# Patient Record
Sex: Female | Born: 1970 | Hispanic: No | Marital: Married | Smoking: Never smoker
Health system: Southern US, Community
[De-identification: ages and names within clinical notes are randomized; demographics above are authoritative.]

## PROBLEM LIST (undated history)

## (undated) DIAGNOSIS — R011 Cardiac murmur, unspecified: Secondary | ICD-10-CM

## (undated) DIAGNOSIS — T7840XA Allergy, unspecified, initial encounter: Secondary | ICD-10-CM

## (undated) DIAGNOSIS — M810 Age-related osteoporosis without current pathological fracture: Secondary | ICD-10-CM

## (undated) HISTORY — PX: OTHER SURGICAL HISTORY: SHX169

## (undated) HISTORY — PX: TONSILLECTOMY: SUR1361

## (undated) HISTORY — PX: ABLATION: SHX5711

## (undated) HISTORY — DX: Cardiac murmur, unspecified: R01.1

## (undated) HISTORY — PX: COLONOSCOPY W/ POLYPECTOMY: SHX1380

## (undated) HISTORY — PX: APPENDECTOMY: SHX54

## (undated) HISTORY — DX: Allergy, unspecified, initial encounter: T78.40XA

## (undated) HISTORY — DX: Age-related osteoporosis without current pathological fracture: M81.0

---

## 1999-08-09 ENCOUNTER — Other Ambulatory Visit: Admission: RE | Admit: 1999-08-09 | Discharge: 1999-08-09 | Payer: Self-pay | Admitting: Orthopedic Surgery

## 2014-09-24 ENCOUNTER — Ambulatory Visit (INDEPENDENT_AMBULATORY_CARE_PROVIDER_SITE_OTHER): Payer: BLUE CROSS/BLUE SHIELD | Admitting: Neurology

## 2014-09-24 ENCOUNTER — Encounter: Payer: Self-pay | Admitting: Neurology

## 2014-09-24 DIAGNOSIS — M542 Cervicalgia: Secondary | ICD-10-CM

## 2014-09-24 DIAGNOSIS — M436 Torticollis: Secondary | ICD-10-CM

## 2014-09-24 DIAGNOSIS — Z0289 Encounter for other administrative examinations: Secondary | ICD-10-CM

## 2014-09-24 DIAGNOSIS — M62838 Other muscle spasm: Secondary | ICD-10-CM

## 2014-09-24 DIAGNOSIS — M6248 Contracture of muscle, other site: Secondary | ICD-10-CM

## 2014-09-24 NOTE — Progress Notes (Signed)
[facility]  PATIENT: Lindell SparMichelle Harvell DOB: 07/07/1971  REFERRING DOCTOR OR PCP:  N/A SOURCE: patient  _________________________________   HISTORICAL  CHIEF COMPLAINT:  Chief Complaint  Patient presents with  . Neck Pain    HISTORY OF PRESENT ILLNESS:  Fredda HammedMichele Voller is a 44 year old woman who had the sudden onset of neck pain several days ago. She notes that she turned her head as her husband walked into the room and had a sudden spasm of pain in the left upper neck. She reported that the pain stayed in the neck and did not radiate into the head or either arm or further down her spine. Pain is generally worse when she rotates her head to the left and is reduced when she is looking straight ahead. She denies any weakness or numbness in the arms or legs. She denies any bladder changes. She has been taking Motrin 600 or 800 mg 3 times a day without benefit. Her current pain is about the same as it was 2 days ago.  She has not had pain like this in the past. As otherwise healthy and is not on any current medications.  REVIEW OF SYSTEMS: Constitutional: No fevers, chills, sweats, or change in appetite Eyes: No visual changes, double vision, eye pain Ear, nose and throat: No hearing loss, ear pain, nasal congestion, sore throat Cardiovascular: No chest pain, palpitations Respiratory: No shortness of breath at rest or with exertion.   No wheezes GastrointestinaI: No nausea, vomiting, diarrhea, abdominal pain, fecal incontinence Genitourinary: No dysuria, urinary retention or frequency.  No nocturia. Musculoskeletal: see above Integumentary: No rash, pruritus, skin lesions Neurological: No numbness, weakness. No headache. Psychiatric: No depression at this time.  No anxiety Endocrine: No palpitations, diaphoresis, change in appetite, change in weigh or increased thirst Hematologic/Lymphatic: No anemia, purpura, petechiae. Allergic/Immunologic: No itchy/runny  eyes, nasal congestion, recent allergic reactions, rashes  ALLERGIES: Allergies  Allergen Reactions  . Sulfa Antibiotics Swelling  . Penicillins Rash    HOME MEDICATIONS: No current outpatient prescriptions on file.  PAST MEDICAL HISTORY: History reviewed. No pertinent past medical history.  PAST SURGICAL HISTORY: Past Surgical History  Procedure Laterality Date  . Tonsillectomy    . Appendectomy      FAMILY HISTORY: History reviewed. No pertinent family history.  SOCIAL HISTORY:  History   Social History  . Marital Status: Unknown    Spouse Name: N/A  . Number of Children: N/A  . Years of Education: N/A   Occupational History  . Not on file.   Social History Main Topics  . Smoking status: Never Smoker   . Smokeless tobacco: Not on file  . Alcohol Use: 0.0 oz/week    0 Standard drinks or equivalent per week     Comment: social  . Drug Use: No  . Sexual Activity: Not on file   Other Topics Concern  . Not on file   Social History Narrative  . No narrative on file     PHYSICAL EXAM  There were no vitals filed for this visit.  There is no height or weight on file to calculate BMI.   General: The patient is well-developed and well-nourished and in no acute distress  Neck: The neck is supple,  The neck is tender over left splenius capitis and upper carviacl paraspinal muscles but nontender in trapezius, rhomboids and shoulders.      Neurologic Exam  Mental status: The patient is alert and oriented x 3 at the time of the  examination.  Speech is normal.  Cranial nerves: Extraocular movements are full Facial symmetry is present. There is good facial sensation to soft touch bilaterally.Facial strength is normal.  Trapezius and sternocleidomastoid strength is normal. No dysarthria is noted.    Motor:  Muscle bulk is normal.   Tone is normal. Strength is  5 / 5 in arms   Sensory: Sensory testing is intact to touch in arms.  Coordination: Cerebellar  testing reveals good finger-nose-finger bilaterally.  Gait and station: Station is normal.   Gait is normal.    Reflexes: Deep tendon reflexes are symmetric and normal in arms.    DIAGNOSTIC DATA (LABS, IMAGING, TESTING) - I reviewed patient records, labs, notes, testing and imaging myself where available.   ASSESSMENT AND PLAN  Neck pain  Neck stiffness  Muscle spasms of neck   In summary, Monroe Toure is a 44 year old woman with recent sudden onset of neck pain who continues to have pain and stiffness in she appears to have some muscle spasms in the left upper neck on examination. Trigger point injection was performed of the left splenius capitis muscle and the C3/C4 paraspinal muscles. She tolerated the procedure well and there were no complications. She will report back tomorrow on her progress. If not better, I will call in a muscle relaxant. I would also consider x-ray at that time. She is advised to continue to be active. She will return to see me as needed.   Dally Oshel A. Epimenio Foot, MD, PhD 09/24/2014, 12:41 PM Certified in Neurology, Clinical Neurophysiology, Sleep Medicine, Pain Medicine and Neuroimaging  [facility] [address] [place], [place] [address] [phone]

## 2014-09-25 ENCOUNTER — Other Ambulatory Visit: Payer: Self-pay | Admitting: Neurology

## 2014-09-25 ENCOUNTER — Ambulatory Visit: Payer: Self-pay

## 2014-09-25 ENCOUNTER — Ambulatory Visit
Admission: RE | Admit: 2014-09-25 | Discharge: 2014-09-25 | Disposition: A | Discharge status: Home / Self Care | Payer: Self-pay | Source: Ambulatory Visit | Attending: Neurology | Admitting: Neurology

## 2014-09-25 DIAGNOSIS — M542 Cervicalgia: Secondary | ICD-10-CM

## 2016-05-19 DIAGNOSIS — M25522 Pain in left elbow: Secondary | ICD-10-CM

## 2016-05-19 DIAGNOSIS — M25521 Pain in right elbow: Secondary | ICD-10-CM | POA: Insufficient documentation

## 2016-08-07 ENCOUNTER — Other Ambulatory Visit: Payer: Self-pay | Admitting: *Deleted

## 2016-08-07 MED ORDER — OSELTAMIVIR PHOSPHATE 75 MG PO CAPS: 75.0000 mg | ORAL_CAPSULE | Freq: Every day | ORAL | 0 refills | Status: DC

## 2017-06-19 ENCOUNTER — Ambulatory Visit: Payer: BLUE CROSS/BLUE SHIELD | Attending: Orthopedic Surgery | Admitting: Occupational Therapy

## 2017-06-19 DIAGNOSIS — M6281 Muscle weakness (generalized): Secondary | ICD-10-CM | POA: Diagnosis present

## 2017-06-19 DIAGNOSIS — M25522 Pain in left elbow: Secondary | ICD-10-CM | POA: Diagnosis present

## 2017-06-19 NOTE — Therapy (Signed)
[facility] [address] [place], [place], [address] Phone: [phone]   Fax:  [phone]  Occupational Therapy Evaluation  Patient Details  Name: Susan SparMichelle Massey MRN: [id number] Date of Birth: 07/10/1971 Referring Provider: Karie ChimeraBrian Buchanan   Encounter Date: 06/19/2017  OT End of Session - 06/19/17 1300    Visit Number  1    Number of Visits  8    Date for OT Re-Evaluation  07/19/17    Authorization Type  BC/BS    OT Start Time  1145    OT Stop Time  1230    OT Time Calculation (min)  45 min    Activity Tolerance  Patient tolerated treatment well    Behavior During Therapy  [facility] for tasks assessed/performed       No past medical history on file.  Past Surgical History:  Procedure Laterality Date  . APPENDECTOMY    . TONSILLECTOMY      There were no vitals filed for this visit.  Subjective Assessment - 06/19/17 1155    Pertinent History  s/p percutaneous ECRB release Lt elbow 05/30/17, Rt lateral epicondylitis    Patient Stated Goals  To get better and hopefully painfree    Currently in Pain?  Yes    Pain Score  -- 2-8/10    Pain Location  Elbow    Pain Orientation  Left    Pain Descriptors / Indicators  Throbbing    Pain Type  Chronic pain    Aggravating Factors   in the am, stretching in flexion    Pain Relieving Factors  rest, heat        [facility] OT Assessment - 06/19/17 0001      Assessment   Diagnosis  s/p percutaneous ECRB Release    Referring Provider  Karie ChimeraBrian Buchanan    Onset Date  05/30/17    Assessment  Pt has full ROM, most limiting factor is pain    Prior Therapy  previous outpatient therapy      Precautions   Precaution Comments  Strengthening ok 3 weeks post-op      Balance Screen   Has the patient fallen in the past 6 months  No    Has the patient had a decrease in activity level because of a fear of falling?   No    Is the patient reluctant to leave their home because of a fear  of falling?   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      ADL   Eating/Feeding  Independent    Grooming  Independent    Upper Body Bathing  Independent    Lower Body Bathing  Independent    Upper Body Dressing  Increased time pain donning shirt    Lower Body Dressing  Independent    Toilet Transfer  Independent    Toileting -  Geneticist, molecularHygiene  Independent    Tub/Shower Transfer  Independent      IADL   Shopping  Takes care of all shopping needs independently    Light Housekeeping  Performs light daily tasks such as dishwashing, bed making    Meal Prep  Plans, prepares and serves adequate meals independently husband assist taking heavy items off stove    Medication Management  Is  responsible for taking medication in correct dosages at correct time    Financial Management  Manages financial matters independently (budgets, writes checks, pays rent, bills goes to bank), collects and keeps track of income      Mobility   Mobility Status  Independent      Written Expression   Dominant Hand  Right      Sensation   Light Touch  Appears Intact      Coordination   Gross Motor Movements are Fluid and Coordinated  Yes    Fine Motor Movements are Fluid and Coordinated  Yes    Coordination  intact      Edema   Edema  very mild at lateral epicondyle      ROM / Strength   AROM / PROM / Strength  AROM      AROM   Overall AROM Comments  BUE AROM WNL's, pain with active wrist ext when elbow in extended and extreme stretching in wrist flexion with full elbow ext and IR      Hand Function   Right Hand Grip (lbs)  55 lbs    Left Hand Grip (lbs)  49 lbs                      OT Education - 06/19/17 1301    Education provided  Yes    Education Details  use of heat/ice, how to perform proper massage, initial HEP (elbow flexed with wrist  flex/ext, elbow extended with wrist flex/ext)     Person(s) Educated  Patient    Methods  Explanation;Demonstration    Comprehension  Verbalized understanding          OT Long Term Goals - 06/19/17 1306      OT LONG TERM GOAL #1   Title  Independent with HEP     Time  4    Period  Weeks    Status  On-going    Target Date  07/19/17      OT LONG TERM GOAL #2   Title  Pt to report pain less than or equal to 5/10 consistently    Time  4    Period  Weeks    Status  New      OT LONG TERM GOAL #3   Title  Pt to return to using LUE as normal for bilateral tasks    Time  4    Period  Weeks    Status  New            Plan - 06/19/17 1302    Clinical Impression Statement  Pt is a 46 y.o. female who presents to outpatient rehab s/p percutaneous ECRB Release Lt elbow for pain management of lateral epicondylitis over several years. Pt also has lateral epicondylitis Rt elbow but pt reports fairly under control. Pt with pain with extreme stretching and certain tasks/movements at Lt elbow    Occupational Profile and client history currently impacting functional performance  Pt has had bilateral lateral epicondylitis for years limiting strength and ability to perform some IADLS and work related tasks.     Occupational performance deficits (Please refer to evaluation for details):  ADL's;IADL's;Work    Rehab Potential  Good    OT Frequency  2x / week    OT Duration  4 weeks    OT Treatment/Interventions  Moist Heat;Self-care/ADL training;Splinting;Patient/family education;Compression bandaging;Scar mobilization;Therapeutic activities;Ultrasound;DME and/or AE instruction;Fluidtherapy;Passive range of motion;Electrical Stimulation;Parrafin;Manual Therapy    Plan  ultrasound,  forearm gym, begin strengthening (per protocol)    Clinical Decision Making  Limited treatment options, no task modification necessary    Consulted and Agree with Plan of Care  Patient       Patient will benefit  from skilled therapeutic intervention in order to improve the following deficits and impairments:  Increased edema, Impaired UE functional use, Pain, Decreased strength  Visit Diagnosis: Muscle weakness (generalized) - Plan: Ot plan of care cert/re-cert  Pain in left elbow - Plan: Ot plan of care cert/re-cert    Problem List Patient Active Problem List   Diagnosis Date Noted  . Neck pain 09/24/2014  . Neck stiffness 09/24/2014  . Muscle spasms of neck 09/24/2014    Kelli Churn, OTR/L 06/19/2017, 2:23 PM  [facility] [address] [place], [place], [address] Phone: [phone]   Fax:  [phone]  Name: Susan Massey MRN: [id number] Date of Birth: 1971-07-19

## 2017-06-21 ENCOUNTER — Ambulatory Visit: Payer: BLUE CROSS/BLUE SHIELD | Admitting: Occupational Therapy

## 2017-06-21 DIAGNOSIS — M25522 Pain in left elbow: Secondary | ICD-10-CM

## 2017-06-21 DIAGNOSIS — M6281 Muscle weakness (generalized): Secondary | ICD-10-CM

## 2017-06-21 NOTE — Therapy (Signed)
[facility] [address] [place], [place], [address] Phone: [phone]   Fax:  [phone]  Occupational Therapy Treatment  Patient Details  Name: Susan Massey MRN: [id number] Date of Birth: 11/12/1970 Referring Provider: Karie ChimeraBrian Buchanan   Encounter Date: 06/21/2017  OT End of Session - 06/21/17 0934    Visit Number  2    Number of Visits  8    Date for OT Re-Evaluation  07/19/17    Authorization Type  BC/BS    OT Start Time  0845    OT Stop Time  0930    OT Time Calculation (min)  45 min    Activity Tolerance  Patient tolerated treatment well    Behavior During Therapy  [facility] for tasks assessed/performed       No past medical history on file.  Past Surgical History:  Procedure Laterality Date  . APPENDECTOMY    . TONSILLECTOMY      There were no vitals filed for this visit.  Subjective Assessment - 06/21/17 0909    Subjective   The pain is usually the worse after a full work day and in the am when I first wake up    Pertinent History  s/p percutaneous ECRB release Lt elbow 05/30/17, Rt lateral epicondylitis    Patient Stated Goals  To get better and hopefully painfree    Currently in Pain?  Yes    Pain Score  4     Pain Location  Elbow    Pain Orientation  Left    Pain Descriptors / Indicators  Sore;Throbbing    Pain Type  Chronic pain    Pain Onset  More than a month ago    Pain Frequency  Intermittent    Aggravating Factors   in the am, stretching in extreme flexion    Pain Relieving Factors  rest, heat                   OT Treatments/Exercises (OP) - 06/21/17 0001      ADLs   ADL Comments  Reviewed proper use of heat, ice, massage.       Exercises   Exercises  Elbow;Wrist      Elbow Exercises   Other elbow exercises  UBE x 5 min. Level 4 resistance    Other elbow exercises  Pt instructed to add elbow flexion and extension strengthening to HEP with 2 lb. weight (2-3x/day, 10  reps each). Pt agrees      Wrist Exercises   Other wrist exercises  Forearm gym x 4 full repetitions for combined wrist and forearm movement    Other wrist exercises  A/ROM, P/ROM and strengthening ex's in wrist flexion and extension - see pt instructions for details. Pt performed each x 10 reps      Modalities   Modalities  Ultrasound      Ultrasound   Ultrasound Location  lateral proximal elbow    Ultrasound Parameters  0.8 wts/cm2, 3 Mhz, Continuous x 8 min.     Ultrasound Goals  Pain             OT Education - 06/21/17 0926    Education provided  Yes    Education Details  A/ROM, P/ROM, and strengthening HEP, review of use of heat, ice, massage    Person(s) Educated  Patient    Methods  Explanation;Demonstration;Handout    Comprehension  Verbalized understanding;Returned demonstration          OT  Long Term Goals - 06/21/17 0934      OT LONG TERM GOAL #1   Title  Independent with HEP     Time  4    Period  Weeks    Status  Achieved      OT LONG TERM GOAL #2   Title  Pt to report pain less than or equal to 5/10 consistently    Time  4    Period  Weeks    Status  On-going      OT LONG TERM GOAL #3   Title  Pt to return to using LUE as normal for bilateral tasks    Time  4    Period  Weeks    Status  On-going            Plan - 06/21/17 0934    Clinical Impression Statement  Pt tolerating HEP well. Pt also responding well to ultrasound    Rehab Potential  Good    OT Frequency  2x / week    OT Duration  4 weeks    OT Treatment/Interventions  Moist Heat;Self-care/ADL training;Splinting;Patient/family education;Compression bandaging;Scar mobilization;Therapeutic activities;Ultrasound;DME and/or AE instruction;Fluidtherapy;Passive range of motion;Electrical Stimulation;Parrafin;Manual Therapy    Plan  continue ultrasound, stretches, and strengthening for Lt wrist/elbow    Consulted and Agree with Plan of Care  Patient       Patient will benefit from  skilled therapeutic intervention in order to improve the following deficits and impairments:  Increased edema, Impaired UE functional use, Pain, Decreased strength  Visit Diagnosis: Muscle weakness (generalized)  Pain in left elbow    Problem List Patient Active Problem List   Diagnosis Date Noted  . Neck pain 09/24/2014  . Neck stiffness 09/24/2014  . Muscle spasms of neck 09/24/2014    Kelli ChurnBallie, Filicia Scogin Johnson, OTR/L 06/21/2017, 9:36 AM  [facility] [address] [place], [place], [address] Phone: [phone]   Fax:  [phone]  Name: Susan Massey MRN: [id number] Date of Birth: 08/23/1970

## 2017-06-21 NOTE — Patient Instructions (Signed)
  Heat in the AM, followed by massage, and stretches in wrist flexion and extension with elbow straight (holding 20 sec. each way) x 3 to 5 reps  Active wrist flexion and extension with elbow bent, then with elbow straight 4-6x/day  Ice massage after work 5-7 minutes or ice pack for 15 minutes   STRENGTHENING: 2-3x/day  Elbow bent, palm down, wrist up and down x 10 reps with 2 lb. Weight. Repeat with palm up  Elbow straight, palm down, wrist up and down x 10 reps with 1 lb. Weight. Repeat with palm up

## 2017-06-22 ENCOUNTER — Encounter: Payer: Self-pay | Admitting: Occupational Therapy

## 2017-06-26 ENCOUNTER — Ambulatory Visit: Payer: BLUE CROSS/BLUE SHIELD | Attending: Orthopedic Surgery | Admitting: Occupational Therapy

## 2017-06-26 DIAGNOSIS — M25522 Pain in left elbow: Secondary | ICD-10-CM | POA: Diagnosis present

## 2017-06-26 DIAGNOSIS — M6281 Muscle weakness (generalized): Secondary | ICD-10-CM | POA: Insufficient documentation

## 2017-06-26 NOTE — Therapy (Signed)
[facility] [address] [place], [place], [address] Phone: [phone]   Fax:  [phone]  Occupational Therapy Treatment  Patient Details  Name: Susan SparMichelle Boise MRN: [id number] Date of Birth: 07/07/1971 Referring Provider: Karie ChimeraBrian Buchanan   Encounter Date: 06/26/2017  OT End of Session - 06/26/17 1305    Visit Number  3    Number of Visits  8    Date for OT Re-Evaluation  07/19/17    Authorization Type  BC/BS    OT Start Time  1145    OT Stop Time  1230    OT Time Calculation (min)  45 min    Activity Tolerance  Patient tolerated treatment well    Behavior During Therapy  [facility] for tasks assessed/performed       No past medical history on file.  Past Surgical History:  Procedure Laterality Date  . APPENDECTOMY    . TONSILLECTOMY      There were no vitals filed for this visit.  Subjective Assessment - 06/26/17 1300    Subjective   It's about the same    Pertinent History  s/p percutaneous ECRB release Lt elbow 05/30/17, Rt lateral epicondylitis    Patient Stated Goals  To get better and hopefully painfree    Currently in Pain?  Yes    Pain Score  4     Pain Location  Elbow    Pain Orientation  Left    Pain Descriptors / Indicators  Sore;Tender    Pain Type  Chronic pain    Pain Onset  More than a month ago    Pain Frequency  Intermittent    Aggravating Factors   in the am, stretching in extreme wrist flexion    Pain Relieving Factors  rest, heat                   OT Treatments/Exercises (OP) - 06/26/17 0001      Elbow Exercises   Other elbow exercises  Elbow flexion and extension each x 10 reps against gravity with 2 lb. weight      Wrist Exercises   Other wrist exercises  wrist flexion/extension each x 10 reps with 2 lb. weight elbow bent, followed by wrist flex/ext each x 10 reps with 1 lb. weight with elbow straight      Ultrasound   Ultrasound Location  lateral proximal elbow    Ultrasound Parameters  0.8 wts/cm2, 3 Mhz, continuous x 8 min.    Ultrasound Goals  Pain      Manual Therapy   Manual Therapy  Soft tissue mobilization    Soft tissue mobilization  along wrist extensor musculature                  OT Long Term Goals - 06/21/17 0934      OT LONG TERM GOAL #1   Title  Independent with HEP     Time  4    Period  Weeks    Status  Achieved      OT LONG TERM GOAL #2   Title  Pt to report pain less than or equal to 5/10 consistently    Time  4    Period  Weeks    Status  On-going      OT LONG TERM GOAL #3   Title  Pt to return to using LUE as normal for bilateral tasks    Time  4    Period  Weeks  Status  On-going            Plan - 06/26/17 1305    Clinical Impression Statement  Pt continues to make steady progress towards goals and tolerating strengthening well.     Rehab Potential  Good    OT Frequency  2x / week    OT Duration  4 weeks    OT Treatment/Interventions  Self-care/ADL training;Moist Heat;Fluidtherapy;DME and/or AE instruction;Splinting;Therapeutic activities;Ultrasound;Therapeutic exercise;Scar mobilization;Cryotherapy;Iontophoresis;Passive range of motion;Electrical Stimulation;Paraffin;Manual Therapy;Patient/family education    Plan  continue US, UBE, stretches and strengthening Lt wrist and elbow, add forearm supination and pronation w/ hammer, wrist winder    Consulted and Agree with Plan of Care  Patient       Patient will benefit from skilled therapeutic intervention in order to improve the following deficits and impairments:  Increased edema, Impaired UE functional use, Pain, Decreased strength  Visit Diagnosis: Muscle weakness (generalized)  Pain in left elbow    Problem List Patient Active Problem List   Diagnosis Date Noted  . Neck pain 09/24/2014  . Neck stiffness 09/24/2014  . Muscle spasms of neck 09/24/2014    Kelli ChurnBallie, Ronold Hardgrove Johnson, OTR/L 06/26/2017, 1:09 PM  [facility] [address] [place], [place], [address] Phone: [phone]   Fax:  [phone]  Name: Susan Massey MRN: [id number] Date of Birth: 07/14/1971

## 2017-06-28 ENCOUNTER — Ambulatory Visit: Payer: BLUE CROSS/BLUE SHIELD | Admitting: Occupational Therapy

## 2017-06-28 DIAGNOSIS — M6281 Muscle weakness (generalized): Secondary | ICD-10-CM

## 2017-06-28 DIAGNOSIS — M25522 Pain in left elbow: Secondary | ICD-10-CM

## 2017-06-28 NOTE — Therapy (Signed)
[facility] [address] [place], [place], [address] Phone: [phone]   Fax:  [phone]  Occupational Therapy Treatment  Patient Details  Name: Susan Massey MRN: [id number] Date of Birth: 09/11/1970 Referring Provider: Karie ChimeraBrian Buchanan   Encounter Date: 06/28/2017  OT End of Session - 06/28/17 1517    Visit Number  4    Number of Visits  8    Date for OT Re-Evaluation  07/19/17    Authorization Type  BC/BS    OT Start Time  1450    OT Stop Time  1530    OT Time Calculation (min)  40 min    Activity Tolerance  Patient tolerated treatment well    Behavior During Therapy  [facility] for tasks assessed/performed       No past medical history on file.  Past Surgical History:  Procedure Laterality Date  . APPENDECTOMY    . TONSILLECTOMY      There were no vitals filed for this visit.  Subjective Assessment - 06/28/17 1528    Pertinent History  s/p percutaneous ECRB release Lt elbow 05/30/17, Rt lateral epicondylitis    Patient Stated Goals  To get better and hopefully painfree    Currently in Pain?  Yes    Pain Score  4     Pain Location  Elbow    Pain Orientation  Left    Pain Descriptors / Indicators  Tender;Sore    Pain Type  Chronic pain    Pain Onset  More than a month ago    Pain Frequency  Intermittent    Aggravating Factors   elbow extension, overuse    Pain Relieving Factors  rest, heat             Treatment:  Elbow Exercises   Other elbow exercises  Elbow flexion and extension each x 10 reps against gravity with 2 lb. weight      Wrist Exercises   Other wrist exercises  wrist flexion/extension each x 10 reps with 2 lb. weight elbow bent, followed by wrist flex/ext each x 10 reps with 1 lb. weight with elbow straight  Wrist winder 3 reps each up/ down with 2 lbs weight, hammer for weighted supination/ pronation, arm bike x 6 mins level 3 for conditioning     Ultrasound   Ultrasound Location   lateral proximal elbow    Ultrasound Parameters  0.8 wts/cm2, 3 Mhz, 20% x 8 min.    Ultrasound Goals  Pain      Manual Therapy   Manual Therapy  Soft tissue mobilization    Soft tissue mobilization  along wrist extensor musculature                         OT Long Term Goals - 06/21/17 0934      OT LONG TERM GOAL #1   Title  Independent with HEP     Time  4    Period  Weeks    Status  Achieved      OT LONG TERM GOAL #2   Title  Pt to report pain less than or equal to 5/10 consistently    Time  4    Period  Weeks    Status  On-going      OT LONG TERM GOAL #3   Title  Pt to return to using LUE as normal for bilateral tasks    Time  4  Period  Weeks    Status  On-going            Plan - 06/28/17 1511    Clinical Impression Statement  Pt continues to make steady progress towards goals.    Occupational Profile and client history currently impacting functional performance  Pt has had bilateral lateral epicondylitis for years limiting strength and ability to perform some IADLS and work related tasks.     Rehab Potential  Good    OT Frequency  2x / week    OT Duration  4 weeks    OT Treatment/Interventions  Self-care/ADL training;Moist Heat;Fluidtherapy;DME and/or AE instruction;Splinting;Therapeutic activities;Ultrasound;Therapeutic exercise;Scar mobilization;Cryotherapy;Iontophoresis;Passive range of motion;Electrical Stimulation;Paraffin;Manual Therapy;Patient/family education    Plan  continue US, UBE, stretches and strengthening Lt wrist and elbow,     Clinical Decision Making  Limited treatment options, no task modification necessary    Consulted and Agree with Plan of Care  Patient       Patient will benefit from skilled therapeutic intervention in order to improve the following deficits and impairments:  Increased edema, Impaired UE functional use, Pain, Decreased strength  Visit Diagnosis: Muscle weakness (generalized)  Pain in left  elbow    Problem List Patient Active Problem List   Diagnosis Date Noted  . Neck pain 09/24/2014  . Neck stiffness 09/24/2014  . Muscle spasms of neck 09/24/2014    Toyia Jelinek 06/28/2017, 3:29 PM Keene BreathKathryn Rayvion Stumph, OTR/L [phone] Phone: [phone] 3:29 PM 06/28/17 [facility] [address] [place], [place], [address] Phone: [phone]   Fax:  [phone]  Name: Susan Massey MRN: [id number] Date of Birth: 04/01/1971

## 2017-06-29 ENCOUNTER — Encounter: Payer: BLUE CROSS/BLUE SHIELD | Admitting: Occupational Therapy

## 2017-07-03 ENCOUNTER — Ambulatory Visit: Payer: BLUE CROSS/BLUE SHIELD | Admitting: Occupational Therapy

## 2017-07-03 DIAGNOSIS — M6281 Muscle weakness (generalized): Secondary | ICD-10-CM

## 2017-07-03 DIAGNOSIS — M25522 Pain in left elbow: Secondary | ICD-10-CM

## 2017-07-03 NOTE — Therapy (Signed)
[facility] [address] [place], [place], [address] Phone: [phone]   Fax:  [phone]  Occupational Therapy Treatment  Patient Details  Name: Susan Massey MRN: [id number] Date of Birth: 07/22/1971 Referring Provider (Historical): Karie ChimeraBrian Buchanan   Encounter Date: 07/03/2017  OT End of Session - 07/03/17 1127    Visit Number  5    Number of Visits  8    Date for OT Re-Evaluation  07/19/17    Authorization Type  BC/BS    OT Start Time  1103    OT Stop Time  1145    OT Time Calculation (min)  42 min    Activity Tolerance  Patient tolerated treatment well    Behavior During Therapy  [facility] for tasks assessed/performed       No past medical history on file.  Past Surgical History:  Procedure Laterality Date  . APPENDECTOMY    . TONSILLECTOMY      There were no vitals filed for this visit.  Subjective Assessment - 07/03/17 1120    Subjective   Pt reports only pain with hiolding a weight    Pertinent History  s/p percutaneous ECRB release Lt elbow 05/30/17, Rt lateral epicondylitis    Patient Stated Goals  To get better and hopefully painfree    Currently in Pain?  Yes    Pain Score  3     Pain Location  Elbow    Pain Orientation  Left    Pain Descriptors / Indicators  Aching    Pain Type  Chronic pain    Pain Onset  More than a month ago    Pain Frequency  Intermittent    Aggravating Factors   elbow extension    Pain Relieving Factors  rest, heat                   Treatment:  Elbow Exercises         Wrist Exercises   Other wrist exercises  wrist flexion/extension each x 10 reps with 2 lb. weight elbow bent, followed by wrist flex/ext each x 10 reps with 2 lb. weight with elbow straight  Wrist winder 3 reps each up/ down with 2 lbs weight, hammer for weighted supination/ pronation, arm bike x 8 mins level 3 for conditioning     Ultrasound   Ultrasound Location  lateral proximal elbow     Ultrasound Parameters  0.8 wts/cm2, 3 Mhz, continuous x 8 min.    Ultrasound Goals  Pain      Manual Therapy   Manual Therapy  Soft tissue mobilization    Soft tissue mobilization  along wrist extensor musculature                   OT Long Term Goals - 06/21/17 0934      OT LONG TERM GOAL #1   Title  Independent with HEP     Time  4    Period  Weeks    Status  Achieved      OT LONG TERM GOAL #2   Title  Pt to report pain less than or equal to 5/10 consistently    Time  4    Period  Weeks    Status  On-going      OT LONG TERM GOAL #3   Title  Pt to return to using LUE as normal for bilateral tasks    Time  4    Period  Weeks  Status  On-going            Plan - 07/03/17 1151    Clinical Impression Statement  Pt continues to make steady progress towards goals. Pt verbalizes understanding of weighted supination/pronation.    Rehab Potential  Good    OT Frequency  2x / week    OT Duration  4 weeks    OT Treatment/Interventions  Self-care/ADL training;Moist Heat;Fluidtherapy;DME and/or AE instruction;Splinting;Therapeutic activities;Ultrasound;Therapeutic exercise;Scar mobilization;Cryotherapy;Iontophoresis;Passive range of motion;Electrical Stimulation;Paraffin;Manual Therapy;Patient/family education    Plan  continue US, UBE, stretches and strengthening Lt wrist and elbow,        Patient will benefit from skilled therapeutic intervention in order to improve the following deficits and impairments:  Increased edema, Impaired UE functional use, Pain, Decreased strength  Visit Diagnosis: Muscle weakness (generalized)  Pain in left elbow    Problem List Patient Active Problem List   Diagnosis Date Noted  . Neck pain 09/24/2014  . Neck stiffness 09/24/2014  . Muscle spasms of neck 09/24/2014   Keene BreathKathryn Azjah Pardo, OTR/L [phone] Phone: [phone] 11:58 AM 07/03/17 Christohper Dube 07/03/2017, 11:55 AM  [facility] [address] [place], [place], [address] Phone: [phone]   Fax:  [phone]  Name: Susan Massey MRN: [id number] Date of Birth: 03/01/1971

## 2017-07-03 NOTE — Patient Instructions (Signed)
 .        AROM: Wrist Flexion    AROM: Forearm Pronation / Supination   With _left_ arm in handshake position, slowly rotate palm down until stretch is felt. Relax. Then rotate palm up until stretch is felt. Repeat _15___ times per set. Do 1-_2___ sessions per day. Hold a Paramediclightweight hammer  Copyright  V[employer]. All rights reserved.

## 2017-07-06 ENCOUNTER — Ambulatory Visit: Payer: BLUE CROSS/BLUE SHIELD | Admitting: Occupational Therapy

## 2017-07-06 DIAGNOSIS — M25522 Pain in left elbow: Secondary | ICD-10-CM

## 2017-07-06 DIAGNOSIS — M6281 Muscle weakness (generalized): Secondary | ICD-10-CM

## 2017-07-06 NOTE — Patient Instructions (Addendum)
Dr. Amanda PeaGramig,  Susan Massey has been receiving occupational therapy for her LUE  ECRB release. We have been using US, massage, stretching and performing strengthening. She reports her pain is consistently 3/10 or less and mainly only present during lifting activities. She is only scheduled for 1 more appointment and is ready to transition to exercising at home. Thanks for this referral.  Sincerely,  Susan Massey, OTR/L

## 2017-07-06 NOTE — Therapy (Signed)
[facility] [address] [place], [place], [address] Phone: [phone]   Fax:  [phone]  Occupational Therapy Treatment  Patient Details  Name: Susan Massey MRN: [id number] Date of Birth: 09/02/1970 Referring Provider (Historical): Karie ChimeraBrian Buchanan   Encounter Date: 07/06/2017  OT End of Session - 07/06/17 0856    Visit Number  6    Number of Visits  8    Date for OT Re-Evaluation  07/19/17    Authorization Type  BC/BS    OT Start Time  0806    OT Stop Time  0844    OT Time Calculation (min)  38 min    Activity Tolerance  Patient tolerated treatment well    Behavior During Therapy  [facility] for tasks assessed/performed       No past medical history on file.  Past Surgical History:  Procedure Laterality Date  . APPENDECTOMY    . TONSILLECTOMY      There were no vitals filed for this visit.  Subjective Assessment - 07/06/17 0827    Pertinent History  s/p percutaneous ECRB release Lt elbow 05/30/17, Rt lateral epicondylitis    Patient Stated Goals  To get better and hopefully painfree    Currently in Pain?  Yes    Pain Score  2     Pain Location  Elbow    Pain Orientation  Left    Pain Descriptors / Indicators  Aching    Pain Type  Chronic pain    Pain Onset  More than a month ago    Pain Frequency  Intermittent    Aggravating Factors   lifting    Pain Relieving Factors  rest, heat                Treatment: Elbow Exercises   Other elbow exercises  Elbow flexion and extension each x 10 reps against gravity with 3 lb. weight      Wrist Exercises   Other wrist exercises  wrist flexion/extension each x 10 reps with 2 lb. weight elbow bent, followed by wrist flex/ext each x 10 reps with 2 lb. weight with elbow straight  Wrist winder 3 reps each up/ down with 2 lbs weight, hammer for weighted supination/ pronation, arm bike x 8 mins level 3 for conditioning     Ultrasound   Ultrasound Location   lateral proximal elbow    Ultrasound Parameters  0.8 wts/cm2, 3 Mhz, continuous x 8 min.    Ultrasound Goals  Pain      Manual Therapy   Manual Therapy  Soft tissue mobilization    Soft tissue mobilization  along wrist extensor musculature             Note sent with pt to MD regarding progress.         OT Long Term Goals - 07/06/17 [id number]      OT LONG TERM GOAL #1   Title  Independent with HEP     Time  4    Period  Weeks    Status  Achieved      OT LONG TERM GOAL #2   Title  Pt to report pain less than or equal to 5/10 consistently    Time  4    Period  Weeks    Status  Achieved      OT LONG TERM GOAL #3   Title  Pt to return to using LUE as normal for bilateral tasks    Time  4    Period  Weeks    Status  On-going 90% of the time            Plan - 07/06/17 0857    Clinical Impression Statement  Pt demonstrates excellent overall progress. She sees MD today and pt may d/c from OT after her MD visit if MD is in agreement.    Rehab Potential  Good    OT Frequency  2x / week    OT Duration  4 weeks    OT Treatment/Interventions  Self-care/ADL training;Moist Heat;Fluidtherapy;DME and/or AE instruction;Splinting;Therapeutic activities;Ultrasound;Therapeutic exercise;Scar mobilization;Cryotherapy;Iontophoresis;Passive range of motion;Electrical Stimulation;Paraffin;Manual Therapy;Patient/family education    Plan  discharge in the next visit or 2.    Consulted and Agree with Plan of Care  Patient       Patient will benefit from skilled therapeutic intervention in order to improve the following deficits and impairments:  Increased edema, Impaired UE functional use, Pain, Decreased strength  Visit Diagnosis: Muscle weakness (generalized)  Pain in left elbow    Problem List Patient Active Problem List   Diagnosis Date Noted  . Neck pain 09/24/2014  . Neck stiffness 09/24/2014  . Muscle spasms of neck 09/24/2014    RINE,KATHRYN 07/06/2017, 8:59  AM Keene BreathKathryn Rine, OTR/L [phone] Phone: [phone] 9:01 AM 07/06/17 [facility] [address] [place], [place], [address] Phone: [phone]   Fax:  [phone]  Name: Susan Massey MRN: [id number] Date of Birth: 01/13/1971

## 2017-07-10 ENCOUNTER — Encounter: Payer: BLUE CROSS/BLUE SHIELD | Admitting: Occupational Therapy

## 2017-07-18 ENCOUNTER — Ambulatory Visit: Payer: Self-pay | Admitting: Family Medicine

## 2017-07-19 ENCOUNTER — Ambulatory Visit: Payer: BLUE CROSS/BLUE SHIELD | Admitting: Family Medicine

## 2017-07-27 ENCOUNTER — Encounter: Payer: Self-pay | Admitting: Family Medicine

## 2017-07-27 ENCOUNTER — Other Ambulatory Visit: Payer: Self-pay

## 2017-07-27 ENCOUNTER — Ambulatory Visit: Payer: BLUE CROSS/BLUE SHIELD | Admitting: Family Medicine

## 2017-07-27 VITALS — BP 96/67 | HR 101 | Temp 99.1°F | Resp 16 | Ht 65.75 in | Wt 132.6 lb

## 2017-07-27 DIAGNOSIS — Z7689 Persons encountering health services in other specified circumstances: Secondary | ICD-10-CM | POA: Diagnosis not present

## 2017-07-27 DIAGNOSIS — R011 Cardiac murmur, unspecified: Secondary | ICD-10-CM | POA: Diagnosis not present

## 2017-07-27 MED ORDER — MOMETASONE FUROATE 50 MCG/ACT NA SUSP: NASAL | 3 refills | Status: DC

## 2017-07-27 MED ORDER — LEVOCETIRIZINE DIHYDROCHLORIDE 5 MG PO TABS: ORAL_TABLET | ORAL | 3 refills | Status: DC

## 2017-07-27 NOTE — Progress Notes (Signed)
Chief Complaint  Patient presents with  . New Patient (Initial Visit)    establish care, not issue or concerns    HPI  Pt is here to establish care She is pretty healthy but her PCP left practice She reports that she [facility] and was told she had a murmur and should see a PCP She has no history of murmurs She denies any fatigue or other issues   Past Medical History:  Diagnosis Date  . Allergy   . Heart murmur     Current Outpatient Medications  Medication Sig Dispense Refill  . levocetirizine (XYZAL) 5 MG tablet TAKE 1 TABLET (5 MG TOTAL) BY MOUTH EVERY EVENING. 90 tablet 3  . meloxicam (MOBIC) 15 MG tablet Take 15 mg by mouth.    . mometasone (NASONEX) 50 MCG/ACT nasal spray 2 sprays by Each Nare route daily. 51 g 3   No current facility-administered medications for this visit.     Allergies:  Allergies  Allergen Reactions  . Sulfa Antibiotics Swelling  . Penicillins Rash    Past Surgical History:  Procedure Laterality Date  . ABLATION    . APPENDECTOMY    . ganglion cyst removal    . left percutaneous release    . meniscus tear    . TONSILLECTOMY      Social History   Socioeconomic History  . Marital status: Married    Spouse name: None  . Number of children: None  . Years of education: None  . Highest education level: None  Social Needs  . Financial resource strain: Not hard at all  . Food insecurity - worry: Never true  . Food insecurity - inability: Never true  . Transportation needs - medical: No  . Transportation needs - non-medical: No  Occupational History  . None  Tobacco Use  . Smoking status: Never Smoker  . Smokeless tobacco: Never Used  Substance and Sexual Activity  . Alcohol use: Yes    Alcohol/week: 0.0 oz    Comment: social  . Drug use: No  . Sexual activity: None  Other Topics Concern  . None  Social History Narrative  . None    Family History  Problem Relation Age of Onset  . Hyperlipidemia Father    . Hypertension Father   . Diabetes Father   . Cancer Maternal Grandmother   . COPD Maternal Grandfather   . Diabetes Maternal Grandfather   . Stroke Paternal Grandmother   . Alzheimer's disease Paternal Grandmother   . Diabetes Paternal Grandfather      Review of Systems  Constitutional: Negative for chills, fever and weight loss.  Respiratory: Negative for cough, hemoptysis, sputum production, shortness of breath and wheezing.   Cardiovascular: Negative for chest pain, palpitations, orthopnea, claudication, leg swelling and PND.  Skin: Negative for itching and rash.  Neurological: Negative for dizziness, tingling, tremors, sensory change and headaches.    Objective: Vitals:   07/27/17 0932  BP: 96/67  Pulse: (!) 101  Resp: 16  Temp: 99.1 F (37.3 C)  TempSrc: Oral  SpO2: 98%  Weight: 132 lb 9.6 oz (60.1 kg)  Height: 5' 5.75" (1.67 m)    Physical Exam  Constitutional: She is oriented to person, place, and time. She appears well-developed and well-nourished.  HENT:  Head: Normocephalic and atraumatic.  Eyes: Conjunctivae and EOM are normal.  Cardiovascular: Normal rate and regular rhythm.  Murmur heard.  Systolic murmur is present with a grade of 2/6. Pulmonary/Chest: Effort  normal and breath sounds normal. No stridor. No respiratory distress. She has no wheezes.  Neurological: She is alert and oriented to person, place, and time.  Skin: Skin is warm. Capillary refill takes less than 2 seconds.  Psychiatric: She has a normal mood and affect. Her behavior is normal. Judgment and thought content normal.    Assessment and Plan Marcelino DusterMichelle was seen today for new patient (initial visit).  Diagnoses and all orders for this visit:  Establishing care with new doctor, encounter for  Newly recognized heart murmur-  Discussed that she should get an echo since this is a new murmur She is asymptomatic so will get as routine outpatient mgmt -     ECHOCARDIOGRAM COMPLETE;  Future  Other orders -     Cancel: Tdap vaccine greater than or equal to 7yo IM -     Cancel: Flu Vaccine QUAD 36+ mos IM -     mometasone (NASONEX) 50 MCG/ACT nasal spray; 2 sprays by Each Nare route daily. -     levocetirizine (XYZAL) 5 MG tablet; TAKE 1 TABLET (5 MG TOTAL) BY MOUTH EVERY EVENING.     Dalinda Heidt A Barclay Lennox

## 2017-07-27 NOTE — Patient Instructions (Addendum)
IF you received an x-ray today, you will receive an invoice from [facility]. Please contact [facility] at [phone] with questions or concerns regarding your invoice.   IF you received labwork today, you will receive an invoice from [facility]. Please contact [facility] at [phone] with questions or concerns regarding your invoice.   Our billing staff will not be able to assist you with questions regarding bills from these companies.  You will be contacted with the lab results as soon as they are available. The fastest way to get your results is to activate your My Chart account. Instructions are located on the last page of this paperwork. If you have not heard from [place] regarding the results in 2 weeks, please contact this office.     Heart Murmur A heart murmur is an extra sound that is caused by chaotic blood flow. The murmur can be heard as a "hum" or "whoosh" sound when blood flows through the heart. The heart has four areas called chambers. Valves separate the upper and lower chambers from each other (tricuspid valve and mitral valve) and separate the lower chambers of the heart from pathways that lead away from the heart (aortic valve and pulmonary valve). Normally, the valves open to let blood flow through or out of your heart, and then they shut to keep the blood from flowing backward. There are two types of heart murmurs:  Innocent murmurs. Most people with this type of heart murmur do not have a heart problem. Many children have innocent heart murmurs. Your health care provider may suggest some basic testing to find out whether your murmur is an innocent murmur. If an innocent heart murmur is found, there is no need for further tests or treatment and no need to restrict activities or stop playing sports.  Abnormal murmurs. These types of murmurs can occur in children and adults. Abnormal murmurs may be a sign of a more serious heart condition, such as a heart  defect present at birth (congenital defect) or heart valve disease.  What are the causes? This condition is caused by heart valves that are not working properly. In children, abnormal heart murmurs are typically caused by congenital defects. In adults, abnormal murmurs are usually from heart valve problems caused by disease, infection, or aging. Three types of heart valve defects can cause a murmur:  Regurgitation. This is when blood leaks back through the valve in the wrong direction.  Mitral valve prolapse. This is when the mitral valve of the heart has a loose flap and does not close tightly.  Stenosis. This is when a valve does not open enough and blocks blood flow.  This condition may also be caused by:  Pregnancy.  Fever.  Overactive thyroid gland.  Anemia.  Exercise.  Rapid growth spurts (in children).  What are the signs or symptoms? Innocent murmurs do not cause symptoms, and many people with abnormal murmurs may or may not have symptoms. If symptoms do develop, they may include:  Shortness of breath.  Blue coloring of the skin, especially on the fingertips.  Chest pain.  Palpitations, or feeling a fluttering or skipped heartbeat.  Fainting.  Persistent cough.  Getting tired much faster than expected.  Swelling in the abdomen, feet, or ankles.  How is this diagnosed? This condition may be diagnosed during a routine physical or other exam. If your health care provider hears a murmur with a stethoscope, he or she will listen for:  Where the murmur is  located in your heart.  How long the murmur lasts (duration).  When the murmur is heard during the heartbeat.  How loud the murmur is. This may help the health care provider figure out what is causing the murmur.  You may be referred to a heart specialist (cardiologist). You may also have other tests, including:  Electrocardiogram (ECG or EKG). This test measures the electrical activity of your  heart.  Echocardiogram. This test uses high frequency sound waves to make pictures of your heart.  MRI or chest X-ray.  Cardiac catheterization. This test looks at blood flow through the heart.  For children and adults who have an abnormal heart murmur and want to stay active, it is important to complete testing, review test results, and receive recommendations from your health care provider. If heart disease is present, it may not be safe to play or be active. How is this treated? Heart murmurs themselves do not need treatment. In some cases, a heart murmur may go away on its own. If an underlying problem or disease is causing the murmur, you may need treatment. If treatment is needed, it will depend on the type and severity of the disease or heart problem causing the murmur. Treatment may include:  Medicine.  Surgery.  Dietary and lifestyle changes.  Follow these instructions at home:  Talk with your health care provider before participating in sports or other activities that require a lot of effort and energy (are strenuous).  Learn as much as possible about your condition and any related diseases. Ask your health care provider if you may at risk for any medical emergencies.  Talk with your health care provider about what symptoms you should look out for.  It is up to you to get your test results. Ask your health care provider, or the department that is doing the test, when your results will be ready.  Keep all follow-up visits as told by your health care provider. This is important. Contact a health care provider if:  You feel light-headed.  You are frequently short of breath.  You feel more tired than usual.  You are having a hard time keeping up with normal activities or fitness routines.  You have swelling in your ankles or feet.  You have chest pain.  You notice that your heart often beats irregularly.  You develop any new symptoms. Get help right away if:  You  develop severe chest pain.  You are having trouble breathing.  You have fainting spells.  Your symptoms suddenly get worse. These symptoms may represent a serious problem that is an emergency. Do not wait to see if the symptoms will go away. Get medical help right away. Call your local emergency services (911 in the [place] Do not drive yourself to the hospital. Summary  Normally, the heart valves open to let blood flow through or out of your heart, and then they shut to keep the blood from flowing backward.  Heart murmur is caused by heart valves that are not working properly.  You may need treatment if an underlying problem or disease is causing the heart murmur. Treatment may include medicine, surgery, or dietary and lifestyle changes.  Talk with your health care provider before participating in sports or other activities that require a lot of effort and energy (are strenuous).  Talk with your health care provider about what symptoms you should watch out for. This information is not intended to replace advice given to you by your health care   provider. Make sure you discuss any questions you have with your health care provider. Document Released: 08/17/2004 Document Revised: 06/28/2016 Document Reviewed: 06/28/2016 Elsevier Interactive Patient Education  [employer]2018 [employer]

## 2017-08-10 ENCOUNTER — Other Ambulatory Visit: Payer: Self-pay

## 2017-08-10 ENCOUNTER — Ambulatory Visit (HOSPITAL_COMMUNITY): Payer: BLUE CROSS/BLUE SHIELD | Attending: Cardiology

## 2017-08-10 DIAGNOSIS — R011 Cardiac murmur, unspecified: Secondary | ICD-10-CM | POA: Diagnosis present

## 2018-06-19 ENCOUNTER — Telehealth: Payer: Self-pay | Admitting: Family Medicine

## 2018-06-19 NOTE — Telephone Encounter (Signed)
MyChart message sent to pt about rescheduling their appt on 08/02/18

## 2018-08-02 ENCOUNTER — Encounter: Payer: BLUE CROSS/BLUE SHIELD | Admitting: Family Medicine

## 2018-08-05 ENCOUNTER — Other Ambulatory Visit: Payer: Self-pay | Admitting: Family Medicine

## 2018-08-05 NOTE — Telephone Encounter (Signed)
Requested medication (s) are due for refill today: Yes  Requested medication (s) are on the active medication list: Yes  Last refill:  07/27/17  Future visit scheduled: Yes  Notes to clinic:  Unable to refill, expired Rx     Requested Prescriptions  Pending Prescriptions Disp Refills   levocetirizine (XYZAL) 5 MG tablet [Pharmacy Med Name: LEVOCETIRIZINE 5 MG TABLET] 90 tablet 3    Sig: TAKE 1 TABLET BY MOUTH EVERY DAY IN THE EVENING     Ear, Nose, and Throat:  Antihistamines Failed - 08/05/2018  1:52 AM      Failed - Valid encounter within last 12 months    Recent Outpatient Visits          1 year ago Newly recognized heart murmur   Primary Care at [facility], Manus Rudd, MD      Future Appointments            In 2 weeks Doristine Bosworth, MD Primary Care at [place], [facility]

## 2018-08-23 ENCOUNTER — Encounter: Payer: Self-pay | Admitting: Family Medicine

## 2018-08-23 ENCOUNTER — Encounter: Payer: BLUE CROSS/BLUE SHIELD | Admitting: Family Medicine

## 2018-08-23 ENCOUNTER — Other Ambulatory Visit: Payer: Self-pay

## 2018-08-23 ENCOUNTER — Ambulatory Visit (INDEPENDENT_AMBULATORY_CARE_PROVIDER_SITE_OTHER): Payer: BLUE CROSS/BLUE SHIELD | Admitting: Family Medicine

## 2018-08-23 ENCOUNTER — Other Ambulatory Visit: Payer: Self-pay | Admitting: Family Medicine

## 2018-08-23 VITALS — BP 116/80 | HR 75 | Temp 97.5°F | Resp 17 | Ht 65.75 in | Wt 136.6 lb

## 2018-08-23 DIAGNOSIS — Z136 Encounter for screening for cardiovascular disorders: Secondary | ICD-10-CM

## 2018-08-23 DIAGNOSIS — Z0000 Encounter for general adult medical examination without abnormal findings: Secondary | ICD-10-CM

## 2018-08-23 DIAGNOSIS — Z124 Encounter for screening for malignant neoplasm of cervix: Secondary | ICD-10-CM

## 2018-08-23 DIAGNOSIS — Z1329 Encounter for screening for other suspected endocrine disorder: Secondary | ICD-10-CM

## 2018-08-23 DIAGNOSIS — Z13220 Encounter for screening for lipoid disorders: Secondary | ICD-10-CM

## 2018-08-23 DIAGNOSIS — Z0001 Encounter for general adult medical examination with abnormal findings: Secondary | ICD-10-CM

## 2018-08-23 DIAGNOSIS — E559 Vitamin D deficiency, unspecified: Secondary | ICD-10-CM

## 2018-08-23 DIAGNOSIS — N926 Irregular menstruation, unspecified: Secondary | ICD-10-CM

## 2018-08-23 NOTE — Patient Instructions (Signed)
° ° ° °  If you have lab work done today you will be contacted with your lab results within the next 2 weeks.  If you have not heard from us then please contact us. The fastest way to get your results is to register for My Chart. ° ° °IF you received an x-ray today, you will receive an invoice from [facility]. Please contact [facility] at [phone] with questions or concerns regarding your invoice.  ° °IF you received labwork today, you will receive an invoice from [facility]. Please contact [facility] at [phone] with questions or concerns regarding your invoice.  ° °Our billing staff will not be able to assist you with questions regarding bills from these companies. ° °You will be contacted with the lab results as soon as they are available. The fastest way to get your results is to activate your My Chart account. Instructions are located on the last page of this paperwork. If you have not heard from us regarding the results in 2 weeks, please contact this office. °  ° ° ° °

## 2018-08-24 LAB — COMPLETE METABOLIC PANEL WITH GFR
AG Ratio: 2.2 (calc) (ref 1.0–2.5)
ALT: 12 U/L (ref 6–29)
AST: 18 U/L (ref 10–35)
Albumin: 4.4 g/dL (ref 3.6–5.1)
Alkaline phosphatase (APISO): 43 U/L (ref 33–115)
BILIRUBIN TOTAL: 0.6 mg/dL (ref 0.2–1.2)
BUN: 19 mg/dL (ref 7–25)
CO2: 28 mmol/L (ref 20–32)
Calcium: 9.5 mg/dL (ref 8.6–10.2)
Chloride: 105 mmol/L (ref 98–110)
Creat: 0.67 mg/dL (ref 0.50–1.10)
GFR, EST NON AFRICAN AMERICAN: 104 mL/min/{1.73_m2} (ref >=60)
GFR, Est African American: 120 mL/min/{1.73_m2} (ref >=60)
Globulin: 2 g/dL (calc) (ref 1.9–3.7)
Glucose, Bld: 86 mg/dL (ref 65–99)
Potassium: 4.4 mmol/L (ref 3.5–5.3)
Sodium: 140 mmol/L (ref 135–146)
Total Protein: 6.4 g/dL (ref 6.1–8.1)

## 2018-08-24 LAB — TSH: TSH: 0.73 mIU/L

## 2018-08-24 LAB — LIPID PANEL
Cholesterol: 157 mg/dL (ref <200)
HDL: 58 mg/dL (ref >50)
LDL Cholesterol (Calc): 85 mg/dL (calc)
Non-HDL Cholesterol (Calc): 99 mg/dL (calc) (ref <130)
Total CHOL/HDL Ratio: 2.7 (calc) (ref <5.0)
Triglycerides: 48 mg/dL (ref <150)

## 2018-08-24 LAB — VITAMIN D 25 HYDROXY (VIT D DEFICIENCY, FRACTURES): Vit D, 25-Hydroxy: 25 ng/mL — ABNORMAL LOW (ref 30–100)

## 2018-08-26 NOTE — Progress Notes (Signed)
Chief Complaint  Patient presents with  . Annual Exam    cpe with pap    Subjective:  Susan Massey is a 48 y.o. female here for a health maintenance visit.  Patient is established pt  Patient Active Problem List   Diagnosis Date Noted  . Arthralgia of both elbows 05/19/2016  . Neck pain 09/24/2014  . Neck stiffness 09/24/2014  . Muscle spasms of neck 09/24/2014    Past Medical History:  Diagnosis Date  . Allergy   . Heart murmur     Past Surgical History:  Procedure Laterality Date  . ABLATION    . APPENDECTOMY    . ganglion cyst removal    . left percutaneous release    . meniscus tear    . TONSILLECTOMY       Outpatient Medications Prior to Visit  Medication Sig Dispense Refill  . levocetirizine (XYZAL) 5 MG tablet TAKE 1 TABLET (5 MG TOTAL) BY MOUTH EVERY EVENING. 90 tablet 3  . mometasone (NASONEX) 50 MCG/ACT nasal spray 2 sprays by Each Nare route daily. 51 g 3  . meloxicam (MOBIC) 15 MG tablet Take 15 mg by mouth.     No facility-administered medications prior to visit.     Allergies  Allergen Reactions  . Sulfa Antibiotics Swelling  . Penicillins Rash     Family History  Problem Relation Age of Onset  . Hyperlipidemia Father   . Hypertension Father   . Diabetes Father   . Cancer Maternal Grandmother   . COPD Maternal Grandfather   . Diabetes Maternal Grandfather   . Stroke Paternal Grandmother   . Alzheimer's disease Paternal Grandmother   . Diabetes Paternal Grandfather      Health Habits: Dental Exam: up to date Eye Exam: up to date Exercise: 5 times/week on average Current exercise activities: walking/running Diet: balanced  Social History   Socioeconomic History  . Marital status: Married    Spouse name: Not on file  . Number of children: Not on file  . Years of education: Not on file  . Highest education level: Not on file  Occupational History  . Not on file  Social Needs  . Financial resource strain: Not hard at all   . Food insecurity:    Worry: Never true    Inability: Never true  . Transportation needs:    Medical: No    Non-medical: No  Tobacco Use  . Smoking status: Never Smoker  . Smokeless tobacco: Never Used  Substance and Sexual Activity  . Alcohol use: Yes    Alcohol/week: 0.0 standard drinks    Comment: social  . Drug use: No  . Sexual activity: Not on file  Lifestyle  . Physical activity:    Days per week: 2 days    Minutes per session: 30 min  . Stress: Not at all  Relationships  . Social connections:    Talks on phone: More than three times a week    Gets together: More than three times a week    Attends religious service: More than 4 times per year    Active member of club or organization: Yes    Attends meetings of clubs or organizations: More than 4 times per year    Relationship status: Married  . Intimate partner violence:    Fear of current or ex partner: No    Emotionally abused: No    Physically abused: No    Forced sexual activity: No  Other Topics Concern  .  Not on file  Social History Narrative  . Not on file   Social History   Substance and Sexual Activity  Alcohol Use Yes  . Alcohol/week: 0.0 standard drinks   Comment: social   Social History   Tobacco Use  Smoking Status Never Smoker  Smokeless Tobacco Never Used   Social History   Substance and Sexual Activity  Drug Use No    GYN: Sexual Health Menstrual status: regular menses LMP: Patient's last menstrual period was 08/18/2018. Last pap smear: see HM section History of abnormal pap smears:  Sexually active:  with female partner Current contraception:   Health Maintenance: See under health Maintenance activity for review of completion dates as well. Immunization History  Administered Date(s) Administered  . Influenza-Unspecified 04/18/2016  . Tdap 05/19/2016     Depression Screen-PHQ2/9 Depression screen PHQ 2/9 07/27/2017  Decreased Interest 0  Down, Depressed, Hopeless 0   PHQ - 2 Score 0    Depression Severity and Treatment Recommendations:  0-4= None  5-9= Mild / Treatment: Support, educate to call if worse; return in one month  10-14= Moderate / Treatment: Support, watchful waiting; Antidepressant or Psycotherapy  15-19= Moderately severe / Treatment: Antidepressant OR Psychotherapy  >= 20 = Major depression, severe / Antidepressant AND Psychotherapy    Review of Systems   ROS  See HPI for ROS as well.  Review of Systems  Constitutional: Negative for activity change, appetite change, chills and fever.  HENT: Negative for congestion, nosebleeds, trouble swallowing and voice change.   Respiratory: Negative for cough, shortness of breath and wheezing.   Gastrointestinal: Negative for diarrhea, nausea and vomiting.  Genitourinary: Negative for difficulty urinating, dysuria, flank pain and hematuria.  Musculoskeletal: Negative for back pain, joint swelling and neck pain.  Neurological: Negative for dizziness, speech difficulty, light-headedness and numbness.  See HPI. All other review of systems negative.    Objective:   Vitals:   08/23/18 0856  BP: 116/80  Pulse: 75  Resp: 17  Temp: (!) 97.5 F (36.4 C)  TempSrc: Oral  SpO2: 98%  Weight: 136 lb 9.6 oz (62 kg)  Height: 5' 5.75" (1.67 m)    Body mass index is 22.22 kg/m.  Physical Exam  BP 116/80 (BP Location: Left Arm, Patient Position: Sitting, Cuff Size: Normal)   Pulse 75   Temp (!) 97.5 F (36.4 C) (Oral)   Resp 17   Ht 5' 5.75" (1.67 m)   Wt 136 lb 9.6 oz (62 kg)   LMP 08/18/2018   SpO2 98%   BMI 22.22 kg/m   General Appearance:    Alert, cooperative, no distress, appears stated age  Head:    Normocephalic, without obvious abnormality, atraumatic  Eyes:    PERRL, conjunctiva/corneas clear, EOM's intact  Ears:    Normal TM's and external ear canals, both ears  Nose:   Nares normal, septum midline, mucosa normal, no drainage    or sinus tenderness  Throat:   Lips,  mucosa, and tongue normal; teeth and gums normal  Neck:   Supple, symmetrical, trachea midline, no adenopathy;    thyroid:  no enlargement/tenderness/nodules; no carotid   bruit or JVD  Back:     Symmetric, no curvature, ROM normal, no CVA tenderness  Lungs:     Clear to auscultation bilaterally, respirations unlabored  Chest Wall:    No tenderness or deformity   Heart:    Regular rate and rhythm, S1 and S2 normal, no murmur, rub   or gallop  Breast Exam:    No tenderness, masses, or nipple abnormality  Abdomen:     Soft, non-tender, bowel sounds active all four quadrants,    no masses, no organomegaly  Genitalia:    Normal female without lesion, discharge or tenderness, menstrual blood in the vaginal vault Pap smear performed No pelvic masses or CMT  Extremities:   Extremities normal, atraumatic, no cyanosis or edema  Pulses:   2+ and symmetric all extremities  Skin:   Skin color, texture, turgor normal, no rashes or lesions  Lymph nodes:   Cervical, supraclavicular, and axillary nodes normal  Neurologic:   CNII-XII intact, normal strength, sensation and reflexes    throughout      Assessment/Plan:   Patient was seen for a health maintenance exam.  Counseled the patient on health maintenance issues. Reviewed her health mainteance schedule and ordered appropriate tests (see orders.) Counseled on regular exercise and weight management. Recommend regular eye exams and dental cleaning.   The following issues were addressed today for health maintenance:   Tikvah was seen today for annual exam.  Diagnoses and all orders for this visit:  Encounter for health maintenance examination  Screening for malignant neoplasm of cervix - Discussed cervical cancer screening  As well as screening for HPV Discussed risk factors for cervical cancer  And hpv vaccination records reviewed  -     Pap IG, CT/NG NAA, and HPV (high risk) [facility]  Vitamin D deficiency -     VITAMIN D 25  Hydroxy (Vit-D Deficiency, Fractures); Future -     Comprehensive metabolic panel; Future  Encounter for lipid screening for cardiovascular disease -     Lipid panel; Future -     TSH; Future -     Comprehensive metabolic panel; Future  Screening for thyroid disorder -     TSH; Future  Irregular menses -     TSH; Future -     Comprehensive metabolic panel; Future    No follow-ups on file.    Body mass index is 22.22 kg/m.:  Discussed the patient's BMI with patient. The BMI body mass index is 22.22 kg/m.     No future appointments.  Patient Instructions       If you have lab work done today you will be contacted with your lab results within the next 2 weeks.  If you have not heard from [place] then please contact us. The fastest way to get your results is to register for My Chart.   IF you received an x-ray today, you will receive an invoice from [facility]. Please contact [facility] at [phone] with questions or concerns regarding your invoice.   IF you received labwork today, you will receive an invoice from [facility]. Please contact [facility] at [phone] with questions or concerns regarding your invoice.   Our billing staff will not be able to assist you with questions regarding bills from these companies.  You will be contacted with the lab results as soon as they are available. The fastest way to get your results is to activate your My Chart account. Instructions are located on the last page of this paperwork. If you have not heard from [place] regarding the results in 2 weeks, please contact this office.

## 2018-08-28 ENCOUNTER — Telehealth: Payer: Self-pay | Admitting: Family Medicine

## 2018-08-28 NOTE — Telephone Encounter (Signed)
Please let the patient know that her labs from [facility] shows that her vitamin D is slightly low at 25 so she should take a multivitamin with D  The cholesterol panel is normal and her liver and kidney is normal.

## 2018-08-28 NOTE — Telephone Encounter (Signed)
Spoke with pt advised her labs showed her vitamin D was low at 25 and she should take multivitamin with D.  Advised cholesterol, liver and kidney panels were normal.  Pt verbalizes understanding but advises she does 2,000 units of vitamin D now and should she add more or what to do?  I advised would contact dr Creta Levin with her question.  Per stallings pt is to add multivitamin along with the 2000 units of vitamin d she is currently taking.  Called pt back no answer but left message on voicemail per ROI to add multivitamin to the vitamin d she is currently taking as it will give her an additional 400-600 units of vitamin.  Advised to call back with any questions or concerns.

## 2018-08-29 LAB — PAP IG, CT-NG NAA, HPV HIGH-RISK
Chlamydia, Nuc. Acid Amp: NEGATIVE
Gonococcus by Nucleic Acid Amp: NEGATIVE
HPV, high-risk: NEGATIVE

## 2018-08-30 LAB — HM MAMMOGRAPHY

## 2019-08-08 ENCOUNTER — Other Ambulatory Visit (HOSPITAL_COMMUNITY): Payer: Self-pay | Admitting: Orthopedic Surgery

## 2019-08-08 ENCOUNTER — Other Ambulatory Visit: Payer: Self-pay | Admitting: Orthopedic Surgery

## 2019-08-08 DIAGNOSIS — M25511 Pain in right shoulder: Secondary | ICD-10-CM

## 2019-08-29 ENCOUNTER — Ambulatory Visit: Payer: Self-pay | Admitting: Family Medicine

## 2019-09-05 ENCOUNTER — Ambulatory Visit: Payer: Self-pay | Admitting: Family Medicine

## 2019-09-16 ENCOUNTER — Telehealth: Payer: Self-pay | Admitting: Neurology

## 2019-09-16 MED ORDER — GABAPENTIN 100 MG PO CAPS: 100.0000 mg | ORAL_CAPSULE | Freq: Every day | ORAL | 4 refills | Status: DC

## 2019-09-16 NOTE — Telephone Encounter (Signed)
Chronic, uncontrollable itching of upper back,   Will try gabapentin 100mg  qhs

## 2019-09-19 ENCOUNTER — Other Ambulatory Visit: Payer: Self-pay

## 2019-09-19 ENCOUNTER — Ambulatory Visit (INDEPENDENT_AMBULATORY_CARE_PROVIDER_SITE_OTHER): Payer: BC Managed Care – PPO | Admitting: Family Medicine

## 2019-09-19 ENCOUNTER — Encounter: Payer: Self-pay | Admitting: Family Medicine

## 2019-09-19 VITALS — BP 107/74 | HR 85 | Temp 97.8°F | Ht 65.0 in | Wt 130.8 lb

## 2019-09-19 DIAGNOSIS — Z13220 Encounter for screening for lipoid disorders: Secondary | ICD-10-CM

## 2019-09-19 DIAGNOSIS — Z136 Encounter for screening for cardiovascular disorders: Secondary | ICD-10-CM

## 2019-09-19 DIAGNOSIS — Z0000 Encounter for general adult medical examination without abnormal findings: Secondary | ICD-10-CM

## 2019-09-19 DIAGNOSIS — Z23 Encounter for immunization: Secondary | ICD-10-CM

## 2019-09-19 DIAGNOSIS — Z114 Encounter for screening for human immunodeficiency virus [HIV]: Secondary | ICD-10-CM

## 2019-09-19 DIAGNOSIS — E559 Vitamin D deficiency, unspecified: Secondary | ICD-10-CM | POA: Diagnosis not present

## 2019-09-19 DIAGNOSIS — Z0001 Encounter for general adult medical examination with abnormal findings: Secondary | ICD-10-CM | POA: Diagnosis not present

## 2019-09-19 DIAGNOSIS — Z131 Encounter for screening for diabetes mellitus: Secondary | ICD-10-CM

## 2019-09-19 NOTE — Patient Instructions (Addendum)
If you have lab work done today you will be contacted with your lab results within the next 2 weeks.  If you have not heard from [place] then please contact us. The fastest way to get your results is to register for My Chart.   IF you received an x-ray today, you will receive an invoice from [facility]. Please contact [facility] at [phone] with questions or concerns regarding your invoice.   IF you received labwork today, you will receive an invoice from [facility]. Please contact [facility] at [phone] with questions or concerns regarding your invoice.   Our billing staff will not be able to assist you with questions regarding bills from these companies.  You will be contacted with the lab results as soon as they are available. The fastest way to get your results is to activate your My Chart account. Instructions are located on the last page of this paperwork. If you have not heard from [place] regarding the results in 2 weeks, please contact this office.      Perimenopause  Perimenopause is the normal time of life before and after menstrual periods stop completely (menopause). Perimenopause can begin 2-8 years before menopause, and it usually lasts for 1 year after menopause. During perimenopause, the ovaries may or may not produce an egg. What are the causes? This condition is caused by a natural change in hormone levels that happens as you get older. What increases the risk? This condition is more likely to start at an earlier age if you have certain medical conditions or treatments, including:  A tumor of the pituitary gland in the brain.  A disease that affects the ovaries and hormone production.  Radiation treatment for cancer.  Certain cancer treatments, such as chemotherapy or hormone (anti-estrogen) therapy.  Heavy smoking and excessive alcohol use.  Family history of early menopause. What are the signs or symptoms? Perimenopausal changes affect each  woman differently. Symptoms of this condition may include:  Hot flashes.  Night sweats.  Irregular menstrual periods.  Decreased sex drive.  Vaginal dryness.  Headaches.  Mood swings.  Depression.  Memory problems or trouble concentrating.  Irritability.  Tiredness.  Weight gain.  Anxiety.  Trouble getting pregnant. How is this diagnosed? This condition is diagnosed based on your medical history, a physical exam, your age, your menstrual history, and your symptoms. Hormone tests may also be done. How is this treated? In some cases, no treatment is needed. You and your health care provider should make a decision together about whether treatment is necessary. Treatment will be based on your individual condition and preferences. Various treatments are available, such as:  Menopausal hormone therapy (MHT).  Medicines to treat specific symptoms.  Acupuncture.  Vitamin or herbal supplements. Before starting treatment, make sure to let your health care provider know if you have a personal or family history of:  Heart disease.  Breast cancer.  Blood clots.  Diabetes.  Osteoporosis. Follow these instructions at home: Lifestyle  Do not use any products that contain nicotine or tobacco, such as cigarettes and e-cigarettes. If you need help quitting, ask your health care provider.  Eat a balanced diet that includes fresh fruits and vegetables, whole grains, soybeans, eggs, lean meat, and low-fat dairy.  Get at least 30 minutes of physical activity on 5 or more days each week.  Avoid alcoholic and caffeinated beverages, as well as spicy foods. This may help prevent hot flashes.  Get 7-8 hours of sleep each  night.  Dress in layers that can be removed to help you manage hot flashes.  Find ways to manage stress, such as deep breathing, meditation, or journaling. General instructions  Keep track of your menstrual periods, including: ? When they occur. ? How  heavy they are and how long they last. ? How much time passes between periods.  Keep track of your symptoms, noting when they start, how often you have them, and how long they last.  Take over-the-counter and prescription medicines only as told by your health care provider.  Take vitamin supplements only as told by your health care provider. These may include calcium, vitamin E, and vitamin D.  Use vaginal lubricants or moisturizers to help with vaginal dryness and improve comfort during sex.  Talk with your health care provider before starting any herbal supplements.  Keep all follow-up visits as told by your health care provider. This is important. This includes any group therapy or counseling. Contact a health care provider if:  You have heavy vaginal bleeding or pass blood clots.  Your period lasts more than 2 days longer than normal.  Your periods are recurring sooner than 21 days.  You bleed after having sex. Get help right away if:  You have chest pain, trouble breathing, or trouble talking.  You have severe depression.  You have pain when you urinate.  You have severe headaches.  You have vision problems. Summary  Perimenopause is the time when a woman's body begins to move into menopause. This may happen naturally or as a result of other health problems or medical treatments.  Perimenopause can begin 2-8 years before menopause, and it usually lasts for 1 year after menopause.  Perimenopausal symptoms can be managed through medicines, lifestyle changes, and complementary therapies such as acupuncture. This information is not intended to replace advice given to you by your health care provider. Make sure you discuss any questions you have with your health care provider. Document Revised: 06/22/2017 Document Reviewed: 08/15/2016 Elsevier Patient Education  2020 [employer].

## 2019-09-19 NOTE — Progress Notes (Signed)
Chief Complaint  Patient presents with  . Annual Exam    CPE    Subjective:  Neilani Duffee is a 49 y.o. female here for a health maintenance visit.  Patient is established pt  Patient Active Problem List   Diagnosis Date Noted  . Arthralgia of both elbows 05/19/2016  . Neck pain 09/24/2014  . Neck stiffness 09/24/2014  . Muscle spasms of neck 09/24/2014    Past Medical History:  Diagnosis Date  . Allergy   . Heart murmur     Past Surgical History:  Procedure Laterality Date  . ABLATION    . APPENDECTOMY    . ganglion cyst removal    . left percutaneous release    . meniscus tear    . TONSILLECTOMY       Outpatient Medications Prior to Visit  Medication Sig Dispense Refill  . fluticasone (FLONASE) 50 MCG/ACT nasal spray Place into both nostrils daily.    Marland Kitchen gabapentin (NEURONTIN) 100 MG capsule Take 1 capsule (100 mg total) by mouth at bedtime. 90 capsule 4  . mometasone (NASONEX) 50 MCG/ACT nasal spray 2 sprays by Each Nare route daily. (Patient not taking: Reported on 09/19/2019) 51 g 3  . levocetirizine (XYZAL) 5 MG tablet TAKE 1 TABLET (5 MG TOTAL) BY MOUTH EVERY EVENING. 90 tablet 3   No facility-administered medications prior to visit.    Allergies  Allergen Reactions  . Sulfa Antibiotics Swelling  . Penicillins Rash  . Tramadol Other (See Comments) and Nausea And Vomiting    Other reaction(s): Vomiting  . Latex Rash and Other (See Comments)     Family History  Problem Relation Age of Onset  . Hyperlipidemia Father   . Hypertension Father   . Diabetes Father   . Cancer Maternal Grandmother   . COPD Maternal Grandfather   . Diabetes Maternal Grandfather   . Stroke Paternal Grandmother   . Alzheimer's disease Paternal Grandmother   . Diabetes Paternal Grandfather      Health Habits: Dental Exam: up to date Eye Exam: up to date Exercise: 4 times/week on average Current exercise activities: walking/running Diet: balanced  Social History   Socioeconomic History  . Marital status: Married    Spouse name: Not on file  . Number of children: Not on file  . Years of education: Not on file  . Highest education level: Not on file  Occupational History  . Not on file  Tobacco Use  . Smoking status: Never Smoker  . Smokeless tobacco: Never Used  Substance and Sexual Activity  . Alcohol use: Yes    Alcohol/week: 0.0 standard drinks    Comment: social  . Drug use: No  . Sexual activity: Not on file  Other Topics Concern  . Not on file  Social History Narrative  . Not on file   Social Determinants of Health   Financial Resource Strain:   . Difficulty of Paying Living Expenses: Not on file  Food Insecurity:   . Worried About Programme researcher, broadcasting/film/video in the Last Year: Not on file  . Ran Out of Food in the Last Year: Not on file  Transportation Needs:   . Lack of Transportation (Medical): Not on file  . Lack of Transportation (Non-Medical): Not on file  Physical Activity:   . Days of Exercise per Week: Not on file  . Minutes of Exercise per Session: Not on file  Stress:   . Feeling of Stress : Not on file  Social Connections:   .  Frequency of Communication with Friends and Family: Not on file  . Frequency of Social Gatherings with Friends and Family: Not on file  . Attends Religious Services: Not on file  . Active Member of Clubs or Organizations: Not on file  . Attends Banker Meetings: Not on file  . Marital Status: Not on file  Intimate Partner Violence:   . Fear of Current or Ex-Partner: Not on file  . Emotionally Abused: Not on file  . Physically Abused: Not on file  . Sexually Abused: Not on file   Social History   Substance and Sexual Activity  Alcohol Use Yes  . Alcohol/week: 0.0 standard drinks   Comment: social   Social History   Tobacco Use  Smoking Status Never Smoker  Smokeless Tobacco Never Used   Social History   Substance and Sexual Activity  Drug Use No    GYN: Sexual  Health Menstrual status: regular menses LMP: Patient's last menstrual period was 08/29/2019.  She went six months without a period and some hot flashes.  Last pap smear: see HM section History of abnormal pap smears:  Sexually active: with female partner   Health Maintenance: See under health Maintenance activity for review of completion dates as well. Immunization History  Administered Date(s) Administered  . Influenza,inj,Quad PF,6+ Mos 05/12/2019  . Influenza,inj,quad, With Preservative 05/09/2017  . Influenza-Unspecified 04/18/2016  . Tdap 05/19/2016      Depression Screen-PHQ2/9 Depression screen [facility] 2/9 09/19/2019 07/27/2017  Decreased Interest 0 0  Down, Depressed, Hopeless 0 0  PHQ - 2 Score 0 0       Depression Severity and Treatment Recommendations:  0-4= None  5-9= Mild / Treatment: Support, educate to call if worse; return in one month  10-14= Moderate / Treatment: Support, watchful waiting; Antidepressant or Psycotherapy  15-19= Moderately severe / Treatment: Antidepressant OR Psychotherapy  >= 20 = Major depression, severe / Antidepressant AND Psychotherapy    Review of Systems   ROS  See HPI for ROS as well.   Review of Systems  Constitutional: Negative for activity change, appetite change, chills and fever.  HENT: Negative for congestion, nosebleeds, trouble swallowing and voice change.   Respiratory: Negative for cough, shortness of breath and wheezing.   Gastrointestinal: Negative for diarrhea, nausea and vomiting.  Genitourinary: Negative for difficulty urinating, dysuria, flank pain and hematuria.  Musculoskeletal: Negative for back pain, joint swelling and neck pain.  Neurological: Negative for dizziness, speech difficulty, light-headedness and numbness.  See HPI. All other review of systems negative.    Objective:   Vitals:   09/19/19 0904  BP: 107/74  Pulse: 85  Temp: 97.8 F (36.6 C)  TempSrc: Temporal  SpO2: 97%  Weight: 130 lb 12.8  oz (59.3 kg)  Height: 5\' 5"  (1.651 m)    Body mass index is 21.77 kg/m.  Physical Exam Constitutional:      Appearance: Normal appearance. She is normal weight.  HENT:     Head: Normocephalic and atraumatic.     Right Ear: Tympanic membrane normal.     Left Ear: Tympanic membrane normal.  Eyes:     Extraocular Movements: Extraocular movements intact.     Conjunctiva/sclera: Conjunctivae normal.  Cardiovascular:     Rate and Rhythm: Normal rate and regular rhythm.     Pulses: Normal pulses.     Heart sounds: No murmur.  Pulmonary:     Effort: Pulmonary effort is normal. No respiratory distress.     Breath sounds:  Normal breath sounds. No stridor. No wheezing, rhonchi or rales.  Chest:     Chest wall: No tenderness.  Abdominal:     General: Abdomen is flat. Bowel sounds are normal. There is no distension.     Palpations: There is no mass.     Tenderness: There is no abdominal tenderness. There is no right CVA tenderness, left CVA tenderness, guarding or rebound.     Hernia: No hernia is present.  Musculoskeletal:        General: No swelling or tenderness. Normal range of motion.     Cervical back: Normal range of motion and neck supple.  Skin:    General: Skin is warm.     Capillary Refill: Capillary refill takes less than 2 seconds.  Neurological:     General: No focal deficit present.     Mental Status: She is alert and oriented to person, place, and time.  Psychiatric:        Mood and Affect: Mood normal.        Behavior: Behavior normal.        Thought Content: Thought content normal.        Judgment: Judgment normal.       Assessment/Plan:   Patient was seen for a health maintenance exam.  Counseled the patient on health maintenance issues. Reviewed her health mainteance schedule and ordered appropriate tests (see orders.) Counseled on regular exercise and weight management. Recommend regular eye exams and dental cleaning.   The following issues were addressed  today for health maintenance:   Susan Massey was seen today for annual exam.  Diagnoses and all orders for this visit:  Encounter for health maintenance examination- Women's Health Maintenance Plan Advised monthly breast exam and annual mammogram Advised dental exam every six months Discussed stress management Discussed pap smear screening guidelines    Encounter for lipid screening for cardiovascular disease -     Cancel: Lipid panel -     Cancel: COMPLETE METABOLIC PANEL WITH GFR; Future -     Cancel: Lipid panel; Future -     COMPLETE METABOLIC PANEL WITH GFR -     Lipid panel  Vitamin D deficiency -     Cancel: Vitamin D, 25-hydroxy -     Cancel: COMPLETE METABOLIC PANEL WITH GFR; Future -     Cancel: Vitamin D 1,25 dihydroxy; Future -     COMPLETE METABOLIC PANEL WITH GFR -     Vitamin D 1,25 dihydroxy  Need for prophylactic vaccination and inoculation against influenza  Screening for diabetes mellitus -     Cancel: Comprehensive metabolic panel -     Cancel: COMPLETE METABOLIC PANEL WITH GFR; Future -     COMPLETE METABOLIC PANEL WITH GFR    No follow-ups on file.    Body mass index is 21.77 kg/m.:  Discussed the patient's BMI with patient. The BMI body mass index is 21.77 kg/m.     No future appointments.  Patient Instructions       If you have lab work done today you will be contacted with your lab results within the next 2 weeks.  If you have not heard from [place] then please contact us. The fastest way to get your results is to register for My Chart.   IF you received an x-ray today, you will receive an invoice from [facility]. Please contact [facility] at [phone] with questions or concerns regarding your invoice.   IF you received labwork today, you will  receive an invoice from [employer]. Please contact [facility] at [phone] with questions or concerns regarding your invoice.   Our billing staff will not be able to assist you  with questions regarding bills from these companies.  You will be contacted with the lab results as soon as they are available. The fastest way to get your results is to activate your My Chart account. Instructions are located on the last page of this paperwork. If you have not heard from [place] regarding the results in 2 weeks, please contact this office.      Perimenopause  Perimenopause is the normal time of life before and after menstrual periods stop completely (menopause). Perimenopause can begin 2-8 years before menopause, and it usually lasts for 1 year after menopause. During perimenopause, the ovaries may or may not produce an egg. What are the causes? This condition is caused by a natural change in hormone levels that happens as you get older. What increases the risk? This condition is more likely to start at an earlier age if you have certain medical conditions or treatments, including:  A tumor of the pituitary gland in the brain.  A disease that affects the ovaries and hormone production.  Radiation treatment for cancer.  Certain cancer treatments, such as chemotherapy or hormone (anti-estrogen) therapy.  Heavy smoking and excessive alcohol use.  Family history of early menopause. What are the signs or symptoms? Perimenopausal changes affect each woman differently. Symptoms of this condition may include:  Hot flashes.  Night sweats.  Irregular menstrual periods.  Decreased sex drive.  Vaginal dryness.  Headaches.  Mood swings.  Depression.  Memory problems or trouble concentrating.  Irritability.  Tiredness.  Weight gain.  Anxiety.  Trouble getting pregnant. How is this diagnosed? This condition is diagnosed based on your medical history, a physical exam, your age, your menstrual history, and your symptoms. Hormone tests may also be done. How is this treated? In some cases, no treatment is needed. You and your health care provider should make a  decision together about whether treatment is necessary. Treatment will be based on your individual condition and preferences. Various treatments are available, such as:  Menopausal hormone therapy (MHT).  Medicines to treat specific symptoms.  Acupuncture.  Vitamin or herbal supplements. Before starting treatment, make sure to let your health care provider know if you have a personal or family history of:  Heart disease.  Breast cancer.  Blood clots.  Diabetes.  Osteoporosis. Follow these instructions at home: Lifestyle  Do not use any products that contain nicotine or tobacco, such as cigarettes and e-cigarettes. If you need help quitting, ask your health care provider.  Eat a balanced diet that includes fresh fruits and vegetables, whole grains, soybeans, eggs, lean meat, and low-fat dairy.  Get at least 30 minutes of physical activity on 5 or more days each week.  Avoid alcoholic and caffeinated beverages, as well as spicy foods. This may help prevent hot flashes.  Get 7-8 hours of sleep each night.  Dress in layers that can be removed to help you manage hot flashes.  Find ways to manage stress, such as deep breathing, meditation, or journaling. General instructions  Keep track of your menstrual periods, including: ? When they occur. ? How heavy they are and how long they last. ? How much time passes between periods.  Keep track of your symptoms, noting when they start, how often you have them, and how long they last.  Take over-the-counter and prescription medicines  only as told by your health care provider.  Take vitamin supplements only as told by your health care provider. These may include calcium, vitamin E, and vitamin D.  Use vaginal lubricants or moisturizers to help with vaginal dryness and improve comfort during sex.  Talk with your health care provider before starting any herbal supplements.  Keep all follow-up visits as told by your health care  provider. This is important. This includes any group therapy or counseling. Contact a health care provider if:  You have heavy vaginal bleeding or pass blood clots.  Your period lasts more than 2 days longer than normal.  Your periods are recurring sooner than 21 days.  You bleed after having sex. Get help right away if:  You have chest pain, trouble breathing, or trouble talking.  You have severe depression.  You have pain when you urinate.  You have severe headaches.  You have vision problems. Summary  Perimenopause is the time when a woman's body begins to move into menopause. This may happen naturally or as a result of other health problems or medical treatments.  Perimenopause can begin 2-8 years before menopause, and it usually lasts for 1 year after menopause.  Perimenopausal symptoms can be managed through medicines, lifestyle changes, and complementary therapies such as acupuncture. This information is not intended to replace advice given to you by your health care provider. Make sure you discuss any questions you have with your health care provider. Document Revised: 06/22/2017 Document Reviewed: 08/15/2016 Elsevier Patient Education  2020 [employer].

## 2019-09-23 LAB — LIPID PANEL
Cholesterol: 198 mg/dL (ref <200)
HDL: 81 mg/dL (ref >=50)
LDL Cholesterol (Calc): 103 mg/dL (calc) — ABNORMAL HIGH
Non-HDL Cholesterol (Calc): 117 mg/dL (calc) (ref <130)
Total CHOL/HDL Ratio: 2.4 (calc) (ref <5.0)
Triglycerides: 59 mg/dL (ref <150)

## 2019-09-23 LAB — COMPLETE METABOLIC PANEL WITH GFR
AG Ratio: 2.1 (calc) (ref 1.0–2.5)
ALT: 14 U/L (ref 6–29)
AST: 16 U/L (ref 10–35)
Albumin: 4.7 g/dL (ref 3.6–5.1)
Alkaline phosphatase (APISO): 50 U/L (ref 31–125)
BUN: 18 mg/dL (ref 7–25)
CO2: 28 mmol/L (ref 20–32)
Calcium: 9.5 mg/dL (ref 8.6–10.2)
Chloride: 102 mmol/L (ref 98–110)
Creat: 0.62 mg/dL (ref 0.50–1.10)
GFR, Est African American: 123 mL/min/{1.73_m2} (ref >=60)
GFR, Est Non African American: 106 mL/min/{1.73_m2} (ref >=60)
Globulin: 2.2 g/dL (calc) (ref 1.9–3.7)
Glucose, Bld: 89 mg/dL (ref 65–99)
Potassium: 4.3 mmol/L (ref 3.5–5.3)
Sodium: 138 mmol/L (ref 135–146)
Total Bilirubin: 0.4 mg/dL (ref 0.2–1.2)
Total Protein: 6.9 g/dL (ref 6.1–8.1)

## 2019-09-23 LAB — VITAMIN D 1,25 DIHYDROXY
Vitamin D 1, 25 (OH)2 Total: 72 pg/mL (ref 18–72)
Vitamin D2 1, 25 (OH)2: <8 pg/mL
Vitamin D3 1, 25 (OH)2: 72 pg/mL

## 2019-09-30 ENCOUNTER — Encounter: Payer: Self-pay | Admitting: Family Medicine

## 2019-10-10 ENCOUNTER — Encounter: Payer: Self-pay | Admitting: Family Medicine

## 2019-10-24 LAB — HM MAMMOGRAPHY: HM Mammogram: NORMAL (ref 0–4)

## 2019-11-12 ENCOUNTER — Other Ambulatory Visit: Payer: Self-pay | Admitting: *Deleted

## 2019-11-12 MED ORDER — GABAPENTIN 100 MG PO CAPS: ORAL_CAPSULE | ORAL | 4 refills | Status: DC

## 2020-03-18 ENCOUNTER — Other Ambulatory Visit: Payer: Self-pay | Admitting: *Deleted

## 2020-03-18 MED ORDER — PREDNISONE 10 MG PO TABS: ORAL_TABLET | ORAL | 0 refills | Status: DC

## 2020-04-02 ENCOUNTER — Ambulatory Visit: Payer: BC Managed Care – PPO | Admitting: Internal Medicine

## 2020-04-02 ENCOUNTER — Other Ambulatory Visit: Payer: Self-pay

## 2020-04-02 ENCOUNTER — Ambulatory Visit
Admission: RE | Admit: 2020-04-02 | Discharge: 2020-04-02 | Disposition: A | Discharge status: Home / Self Care | Payer: Self-pay | Source: Ambulatory Visit | Attending: Internal Medicine | Admitting: Internal Medicine

## 2020-04-02 VITALS — BP 90/60 | HR 88 | Temp 98.7°F | Ht 65.0 in | Wt 130.0 lb

## 2020-04-02 DIAGNOSIS — R0989 Other specified symptoms and signs involving the circulatory and respiratory systems: Secondary | ICD-10-CM

## 2020-04-02 LAB — CBC WITH DIFFERENTIAL/PLATELET
Absolute Monocytes: 614 cells/uL (ref 200–950)
Basophils Absolute: 41 cells/uL (ref 0–200)
Basophils Relative: 0.6 %
Eosinophils Absolute: 62 cells/uL (ref 15–500)
Eosinophils Relative: 0.9 %
HCT: 37.2 % (ref 35.0–45.0)
Hemoglobin: 12.5 g/dL (ref 11.7–15.5)
Lymphs Abs: 1635 cells/uL (ref 850–3900)
MCH: 31 pg (ref 27.0–33.0)
MCHC: 33.6 g/dL (ref 32.0–36.0)
MCV: 92.3 fL (ref 80.0–100.0)
MPV: 11.7 fL (ref 7.5–12.5)
Monocytes Relative: 8.9 %
Neutro Abs: 4547 cells/uL (ref 1500–7800)
Neutrophils Relative %: 65.9 %
Platelets: 244 10*3/uL (ref 140–400)
RBC: 4.03 10*6/uL (ref 3.80–5.10)
RDW: 12.1 % (ref 11.0–15.0)
Total Lymphocyte: 23.7 %
WBC: 6.9 10*3/uL (ref 3.8–10.8)

## 2020-04-02 MED ORDER — PREDNISONE 10 MG PO TABS: ORAL_TABLET | ORAL | 0 refills | Status: DC

## 2020-04-02 MED ORDER — AZITHROMYCIN 250 MG PO TABS: ORAL_TABLET | ORAL | 0 refills | Status: DC

## 2020-04-02 MED ORDER — ALBUTEROL SULFATE HFA 108 (90 BASE) MCG/ACT IN AERS: 2.0000 | INHALATION_SPRAY | Freq: Four times a day (QID) | RESPIRATORY_TRACT | 2 refills | Status: DC | PRN

## 2020-04-02 NOTE — Progress Notes (Addendum)
   Subjective:    Patient ID: Susan Massey, female    DOB: [birth year], 49 y.o.   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  She has worked with Dr. Epimenio Foot for a number of years. She likes her job.  Formerly seen by Dr. Ellamae Sia Practitioner at [facility] location but Dr. Creta Levin is now working with a mobile care unit so patient is establishing here today.  Had health maintenance exam with Dr. Creta Levin in February 2021  Patient is allergic to Sulfa and Penicillin.  Tramadol causes vomiting.  Latex causes a rash.  She has a history of allergic rhinitis.  History of uterine ablation, appendectomy, ganglion cyst removal tonsillectomy and meniscal tear.  Family history: Remarkable for hypertension and hyperlipidemia as well as diabetes in father.  Paternal grandmother had stroke and Alzheimer's disease.  Paternal grandfather had diabetes.  Maternal grandfather had COPD.  Maternal grandmother had cancer.  Social history: Patient is married.  Does not smoke.   Patient and her husband were exposed to a nephew who attended a camp around August 6 and was diagnosed with COVID-19.  Patient also tested positive for COVID-19.  She developed dysgeusia.  Had mild illness and improved but had some residual respiratory symptoms.  Had no sputum production.  She has to clear her throat a lot.  Has not had a fever.  Has had some upper airway congestion.  She was out of work for 10 days returning around August 16.  Still had persistent respiratory symptoms described above.  Some improvement with course of steroids prescribed by physician.    Review of Systems-patient complains of some throat clearing and respiratory congestion status post COVID-19 infection despite short course of steroids over 6 days.  No fever or chills at the present time.   No headache.  No chest pain.     Objective:   Physical Exam Blood pressure 90/60 pulse 88 regular temperature 98.7 degrees orally pulse oximetry 95% weight 130 pounds height 5 feet 5 inches BMI 21.63  Skin warm and dry.  No cervical adenopathy.  No thyromegaly.  TMs clear.  Pharynx is clear.  Neck is supple.  Chest is clear to auscultation without rales or wheezing.  No lower extremity edema.  Cardiac exam is normal.       Assessment & Plan:  Protracted hacking cough status post COVID-19 infection.  Per protocol with her employer, patient is scheduled to take COVID-19 injections before October 8.  However, she is still having symptoms at this point in time and I think she should take Zithromax Z-PAK and a tapering course of prednisone over 6 days.  Also have refilled Ventolin inhaler.  She will have chest x-ray and we will follow-up with her on September 24.  I have written health worker letter asking them to defer her vaccine for the next couple of weeks giving her treatment time to work and clear her cough.  She is a little anxious about the vaccine.  Addendum: Chest x-ray shows no active cardiopulmonary disease which is encouraging.

## 2020-04-10 ENCOUNTER — Encounter: Payer: Self-pay | Admitting: Internal Medicine

## 2020-04-10 NOTE — Patient Instructions (Signed)
Take prednisone in tapering course as directed in Zithromax Z-PAK as directed.  Follow-up September 24.  Chest x-ray is negative.  Letter written to [employer] asking them to defer COVID-19 vaccine until I recheck her on September 24.

## 2020-04-16 ENCOUNTER — Other Ambulatory Visit: Payer: Self-pay

## 2020-04-16 ENCOUNTER — Ambulatory Visit: Payer: BC Managed Care – PPO | Admitting: Internal Medicine

## 2020-04-16 ENCOUNTER — Encounter: Payer: Self-pay | Admitting: Internal Medicine

## 2020-04-16 VITALS — BP 110/70 | HR 72 | Temp 98.6°F | Ht 65.0 in | Wt 127.0 lb

## 2020-04-16 DIAGNOSIS — Z8616 Personal history of COVID-19: Secondary | ICD-10-CM

## 2020-04-16 DIAGNOSIS — R6889 Other general symptoms and signs: Secondary | ICD-10-CM

## 2020-04-16 DIAGNOSIS — R0989 Other specified symptoms and signs involving the circulatory and respiratory systems: Secondary | ICD-10-CM

## 2020-04-16 MED ORDER — PANTOPRAZOLE SODIUM 40 MG PO TBEC: 40.0000 mg | DELAYED_RELEASE_TABLET | Freq: Every day | ORAL | 0 refills | Status: DC

## 2020-04-16 NOTE — Patient Instructions (Addendum)
Try Protonix 40 mg daily. We will arrange for allergy testing.  Physical exam scheduled for March 2022.

## 2020-04-16 NOTE — Progress Notes (Signed)
   Subjective:    Patient ID: Susan Massey, female    DOB: 11-15-1970, 49 y.o.   MRN: [id number]  HPI  49 year old Female seen recently for the first time on September 10 status post COVID-19 infection in early August.  Patient had protracted respiratory symptoms with having to clear her throat a lot.  Has had some upper airway congestion.  She was treated with a course of prednisone tapering over 6 days and a Zithromax Z-PAK when seen here on September 10.  She feels that this was helpful.  Chest x-ray was negative. A letter to employee health asking them to defer her COVID-19 vaccine until today.  They were agreeable to this.  She still clearing her throat a lot.  I do not know if this is related to GE reflux or allergy symptoms.  May have a bit of globus sensation.  I have asked her to try Protonix 40 mg daily.    Review of Systems- she  feels that symptoms have improved overall.     Objective:   Physical Exam BP 100/70, pulse 72 and regular temperature 98.6 degrees orally pulse oximetry 97% weight 127 pounds BMI 21.13  Skin warm and dry.  Neck is supple.  Chest clear to auscultation without rales or wheezing.  Continues to clear her throat a fair amount       Assessment & Plan:  We have discussed the throat clearing at length and her history of allergic rhinitis.  We are going to refer her to Allergist for evaluation.  She feels comfortable going ahead and taking COVID-19 vaccine.  She will have a health maintenance exam here in March.  She is going to try Protonix 40 mg daily to see if this helps her throat clearing.

## 2020-06-04 ENCOUNTER — Ambulatory Visit: Payer: BC Managed Care – PPO | Admitting: Allergy

## 2020-07-22 ENCOUNTER — Ambulatory Visit (INDEPENDENT_AMBULATORY_CARE_PROVIDER_SITE_OTHER): Payer: BC Managed Care – PPO | Admitting: Allergy

## 2020-07-22 ENCOUNTER — Other Ambulatory Visit: Payer: Self-pay

## 2020-07-22 ENCOUNTER — Encounter: Payer: Self-pay | Admitting: Allergy

## 2020-07-22 VITALS — BP 92/68 | HR 75 | Temp 97.9°F | Resp 16 | Ht 65.0 in | Wt 125.8 lb

## 2020-07-22 DIAGNOSIS — J3089 Other allergic rhinitis: Secondary | ICD-10-CM

## 2020-07-22 MED ORDER — CARBINOXAMINE MALEATE 6 MG PO TABS: 1.0000 | ORAL_TABLET | Freq: Two times a day (BID) | ORAL | 1 refills | Status: DC

## 2020-07-22 MED ORDER — IPRATROPIUM BROMIDE 0.03 % NA SOLN: 2.0000 | Freq: Two times a day (BID) | NASAL | 1 refills | Status: DC

## 2020-07-22 MED ORDER — MONTELUKAST SODIUM 10 MG PO TABS: 10.0000 mg | ORAL_TABLET | Freq: Every day | ORAL | 1 refills | Status: DC

## 2020-07-22 NOTE — Progress Notes (Signed)
New Patient Note  RE: Susan Massey MRN: [id number] DOB: [birth year] Date of Office Visit: 07/22/2020  Referring provider: Margaree Mackintosh, MD Primary care provider: Margaree Mackintosh, MD  Chief Complaint: nasal congestion  History of present illness: Susan Massey is a 49 y.o. female presenting today for consultation for chronic nasal congestion.   She reports nasal congestion for years.  She states she has become in the habit of mouth breathing.  She also reports throat clearing.  It does wax and wane over the year.  She did do a trial of Protonix to rule out reflux and didn't change symptoms.  She states she has tried everything (allergra, zyrtec, xyzal, flonase, nasonex) which was not making a difference.  Has performed nasal saline lavage as well.   She denies any ear symptoms, headaches, sinus pressure.  Does not report sinus infections.  She has seen ENT in the past and had rhinoscopy that per report was normal.   She does report she had blood testing for allergens many years ago. Maybe dust mite was positive and she recalls using dust mite covers for bedding.  No history of asthma, eczema or food allergy.  Review of systems: Review of Systems  Constitutional: Negative.   HENT:       See HPI  Eyes: Negative.   Respiratory: Negative.   Cardiovascular: Negative.   Gastrointestinal: Negative.   Musculoskeletal: Negative.   Skin: Negative.   Neurological: Negative.     All other systems negative unless noted above in HPI  Past medical history: Past Medical History:  Diagnosis Date  . Allergy   . Heart murmur     Past surgical history: Past Surgical History:  Procedure Laterality Date  . ABLATION    . APPENDECTOMY    . ganglion cyst removal    . left percutaneous release    . meniscus tear    . TONSILLECTOMY      Family history:  Family History  Problem Relation Age of Onset  . Hyperlipidemia Father   . Hypertension Father   . Diabetes Father   .  Hypertension Brother   . Cancer Maternal Grandmother   . COPD Maternal Grandfather   . Diabetes Maternal Grandfather   . Stroke Paternal Grandmother   . Alzheimer's disease Paternal Grandmother   . Diabetes Paternal Grandfather     Social history: She lives in a home without carpeting with electric heating and central cooling.  Dog in the home that is nonsetting.  There is no concern for water damage, mildew or roaches in the home.  She is a Designer, jewellery and patient.  Ambulatory setting.  She has no smoking history.  Medication List: Current Outpatient Medications  Medication Sig Dispense Refill  . ascorbic acid (VITAMIN C) 250 MG tablet     . Calcium Carb-Cholecalciferol (CALCIUM CARBONATE-VITAMIN D3) 600-400 MG-UNIT TABS     . fexofenadine (ALLEGRA) 60 MG tablet Take 180 mg by mouth 2 (two) times daily.    Marland Kitchen gabapentin (NEURONTIN) 100 MG capsule Take 3 capsules by mouth at bedtime 270 capsule 4  . albuterol (VENTOLIN HFA) 108 (90 Base) MCG/ACT inhaler Inhale 2 puffs into the lungs every 6 (six) hours as needed for wheezing or shortness of breath. 8 g 2  . pantoprazole (PROTONIX) 40 MG tablet Take 1 tablet (40 mg total) by mouth daily. (Patient not taking: Reported on 07/22/2020) 90 tablet 0   No current facility-administered medications for this visit.    Known  medication allergies: Allergies  Allergen Reactions  . Sulfa Antibiotics Swelling  . Penicillins Rash  . Tramadol Other (See Comments) and Nausea And Vomiting    Other reaction(s): Vomiting  . Latex Rash and Other (See Comments)     Physical examination: Blood pressure 92/68, pulse 75, temperature 97.9 F (36.6 C), resp. rate 16, height 5\' 5"  (1.651 m), weight 125 lb 12.8 oz (57.1 kg), SpO2 100 %.  General: Alert, interactive, in no acute distress. HEENT: PERRLA, TMs pearly gray, turbinates moderately edematous without discharge, post-pharynx non erythematous. Neck: Supple without lymphadenopathy. Lungs: Clear  to auscultation without wheezing, rhonchi or rales. {no increased work of breathing. CV: Normal S1, S2 without murmurs. Abdomen: Nondistended, nontender. Skin: Warm and dry, without lesions or rashes. Extremities:  No clubbing, cyanosis or edema. Neuro:   Grossly intact.  Diagnositics/Labs:  Allergy testing: Environmental allergy skin prick testing is positive to grasses, weeds, trees, molds, dust mites, mixed feathers, cockroach, mouse.    Allergy testing results were read and interpreted by provider, documented by clinical staff.   Assessment and plan: Allergic rhinitis  -environmental allergy testing is positive to grasses, weeds, trees, molds, dust mites, mixed feathers, cockroach, mouse.   -allergen avoidance measures discussed/handouts provided -recommend starting nasal Atrovent 2 sprays each nostril twice a day at this time.  Can reduce to as needed use once under better control.  This nasal spray helps with both congestion and drainage.  -recommend trial of Ryvent 6mg  1 tab twice a day.  This is a prescription based antihistamine.  -recommend coupling antihistamine with Singulair (antileukotriene) 10mg  at bedtime.  If you notice any change in mood/behavior/sleep after starting Singulair then stop this medication and let know.  Symptoms resolve after stopping the medication.   -allergen immunotherapy discussed today including protocol, benefits and risk.  Informational handout provided.  If interested in this therapuetic option you can check with your insurance carrier for coverage.  Let know if you would like to proceed with this option.    Follow-up in 4 months or sooner if needed  I appreciate the opportunity to take part in Senta's care. Please do not hesitate to contact me with questions.  Sincerely,   , MD Allergy/Immunology [facility]

## 2020-07-22 NOTE — Patient Instructions (Addendum)
-  environmental allergy testing is positive to grasses, weeds, trees, molds, dust mites, mixed feathers, cockroach, mouse.   -allergen avoidance measures discussed/handouts provided -recommend starting nasal Atrovent 2 sprays each nostril twice a day at this time.  Can reduce to as needed use once under better control.  This nasal spray helps with both congestion and drainage.  -recommend trial of Ryvent 6mg  1 tab twice a day.  This is a prescription based antihistamine.  -recommend coupling antihistamine with Singulair (antileukotriene) 10mg  at bedtime.  If you notice any change in mood/behavior/sleep after starting Singulair then stop this medication and let know.  Symptoms resolve after stopping the medication.   -allergen immunotherapy discussed today including protocol, benefits and risk.  Informational handout provided.  If interested in this therapuetic option you can check with your insurance carrier for coverage.  Let know if you would like to proceed with this option.    Follow-up in 4 months or sooner if needed

## 2020-07-30 ENCOUNTER — Other Ambulatory Visit: Payer: Self-pay

## 2020-07-30 ENCOUNTER — Ambulatory Visit: Payer: BC Managed Care – PPO | Admitting: Allergy

## 2020-07-30 ENCOUNTER — Other Ambulatory Visit: Payer: Self-pay | Admitting: *Deleted

## 2020-07-30 MED ORDER — CARBINOXAMINE MALEATE 4 MG PO TABS: 8.0000 mg | ORAL_TABLET | Freq: Every day | ORAL | 5 refills | Status: DC

## 2020-08-02 ENCOUNTER — Other Ambulatory Visit: Payer: Self-pay | Admitting: *Deleted

## 2020-08-02 MED ORDER — CARBINOXAMINE MALEATE 4 MG PO TABS: ORAL_TABLET | ORAL | 1 refills | Status: DC

## 2020-09-10 ENCOUNTER — Other Ambulatory Visit: Payer: BC Managed Care – PPO | Admitting: Internal Medicine

## 2020-09-24 ENCOUNTER — Encounter: Payer: BC Managed Care – PPO | Admitting: Internal Medicine

## 2020-11-12 ENCOUNTER — Other Ambulatory Visit: Payer: BC Managed Care – PPO | Admitting: Internal Medicine

## 2020-11-12 ENCOUNTER — Other Ambulatory Visit: Payer: Self-pay

## 2020-11-12 DIAGNOSIS — Z1329 Encounter for screening for other suspected endocrine disorder: Secondary | ICD-10-CM

## 2020-11-12 DIAGNOSIS — Z0000 Encounter for general adult medical examination without abnormal findings: Secondary | ICD-10-CM

## 2020-11-12 DIAGNOSIS — Z13220 Encounter for screening for lipoid disorders: Secondary | ICD-10-CM

## 2020-11-12 DIAGNOSIS — Z1321 Encounter for screening for nutritional disorder: Secondary | ICD-10-CM

## 2020-11-12 LAB — HM MAMMOGRAPHY

## 2020-11-13 LAB — CBC WITH DIFFERENTIAL/PLATELET
Absolute Monocytes: 455 cells/uL (ref 200–950)
Basophils Absolute: 51 cells/uL (ref 0–200)
Basophils Relative: 1.1 %
Eosinophils Absolute: 83 cells/uL (ref 15–500)
Eosinophils Relative: 1.8 %
HCT: 41.6 % (ref 35.0–45.0)
Hemoglobin: 13.7 g/dL (ref 11.7–15.5)
Lymphs Abs: 1242 cells/uL (ref 850–3900)
MCH: 30.3 pg (ref 27.0–33.0)
MCHC: 32.9 g/dL (ref 32.0–36.0)
MCV: 92 fL (ref 80.0–100.0)
MPV: 11.3 fL (ref 7.5–12.5)
Monocytes Relative: 9.9 %
Neutro Abs: 2769 cells/uL (ref 1500–7800)
Neutrophils Relative %: 60.2 %
Platelets: 282 10*3/uL (ref 140–400)
RBC: 4.52 10*6/uL (ref 3.80–5.10)
RDW: 11.3 % (ref 11.0–15.0)
Total Lymphocyte: 27 %
WBC: 4.6 10*3/uL (ref 3.8–10.8)

## 2020-11-13 LAB — LIPID PANEL
Cholesterol: 183 mg/dL (ref <200)
HDL: 73 mg/dL (ref >=50)
LDL Cholesterol (Calc): 96 mg/dL (calc)
Non-HDL Cholesterol (Calc): 110 mg/dL (calc) (ref <130)
Total CHOL/HDL Ratio: 2.5 (calc) (ref <5.0)
Triglycerides: 53 mg/dL (ref <150)

## 2020-11-13 LAB — COMPLETE METABOLIC PANEL WITH GFR
AG Ratio: 2.3 (calc) (ref 1.0–2.5)
ALT: 13 U/L (ref 6–29)
AST: 17 U/L (ref 10–35)
Albumin: 4.6 g/dL (ref 3.6–5.1)
Alkaline phosphatase (APISO): 46 U/L (ref 37–153)
BUN: 18 mg/dL (ref 7–25)
CO2: 27 mmol/L (ref 20–32)
Calcium: 9.3 mg/dL (ref 8.6–10.4)
Chloride: 104 mmol/L (ref 98–110)
Creat: 0.64 mg/dL (ref 0.50–1.05)
GFR, Est African American: 120 mL/min/{1.73_m2} (ref >=60)
GFR, Est Non African American: 103 mL/min/{1.73_m2} (ref >=60)
Globulin: 2 g/dL (calc) (ref 1.9–3.7)
Glucose, Bld: 94 mg/dL (ref 65–99)
Potassium: 4.6 mmol/L (ref 3.5–5.3)
Sodium: 140 mmol/L (ref 135–146)
Total Bilirubin: 0.5 mg/dL (ref 0.2–1.2)
Total Protein: 6.6 g/dL (ref 6.1–8.1)

## 2020-11-13 LAB — VITAMIN D 25 HYDROXY (VIT D DEFICIENCY, FRACTURES): Vit D, 25-Hydroxy: 38 ng/mL (ref 30–100)

## 2020-11-13 LAB — TSH: TSH: 1.05 mIU/L

## 2020-11-16 ENCOUNTER — Encounter: Payer: Self-pay | Admitting: Internal Medicine

## 2020-11-19 ENCOUNTER — Ambulatory Visit (INDEPENDENT_AMBULATORY_CARE_PROVIDER_SITE_OTHER): Payer: BC Managed Care – PPO | Admitting: Internal Medicine

## 2020-11-19 ENCOUNTER — Encounter: Payer: Self-pay | Admitting: Internal Medicine

## 2020-11-19 ENCOUNTER — Other Ambulatory Visit: Payer: Self-pay

## 2020-11-19 VITALS — BP 90/60 | HR 84 | Ht 65.0 in | Wt 125.0 lb

## 2020-11-19 DIAGNOSIS — Z0000 Encounter for general adult medical examination without abnormal findings: Secondary | ICD-10-CM

## 2020-11-19 DIAGNOSIS — Z8639 Personal history of other endocrine, nutritional and metabolic disease: Secondary | ICD-10-CM

## 2020-11-19 DIAGNOSIS — Z8616 Personal history of COVID-19: Secondary | ICD-10-CM

## 2020-11-19 DIAGNOSIS — J3089 Other allergic rhinitis: Secondary | ICD-10-CM | POA: Diagnosis not present

## 2020-11-19 DIAGNOSIS — L508 Other urticaria: Secondary | ICD-10-CM

## 2020-11-19 DIAGNOSIS — J301 Allergic rhinitis due to pollen: Secondary | ICD-10-CM

## 2020-11-19 DIAGNOSIS — Z9109 Other allergy status, other than to drugs and biological substances: Secondary | ICD-10-CM

## 2020-11-19 LAB — POCT URINALYSIS DIPSTICK
Appearance: NEGATIVE
Bilirubin, UA: NEGATIVE
Blood, UA: NEGATIVE
Glucose, UA: NEGATIVE
Ketones, UA: NEGATIVE
Leukocytes, UA: NEGATIVE
Nitrite, UA: NEGATIVE
Odor: NEGATIVE
Protein, UA: NEGATIVE
Spec Grav, UA: 1.015 (ref 1.010–1.025)
Urobilinogen, UA: 0.2 E.U./dL
pH, UA: 6.5 (ref 5.0–8.0)

## 2020-11-19 NOTE — Progress Notes (Signed)
   Subjective:    Patient ID: Susan Massey, female    DOB: 09-Dec-1970, 50 y.o.   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 General health is excellent.  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  She likes her job.  She is allergic to Penicillin and Sulfa.  Tramadol causes vomiting.  Latex causes a rash.  She has a history of allergic rhinitis.  History of uterine ablation, appendectomy, ganglion cyst removed, tonsillectomy, meniscal tear.  Social history: She is married.  Does not smoke.  Family history: Remarkable for hypertension and hyperlipidemia as well as diabetes in father.  Maternal grandmother had stroke and Alzheimer's disease.  Paternal grandfather had diabetes.  Maternal grandfather had COPD.  Maternal grandmother had cancer.  She was exposed to a nephew who had attended a camp around August 6 and was subsequently diagnosed with COVID-19.  She developed dysgeusia and had mild illness.  She subsequently has received 2 COVID vaccines.  Tetanus immunization is up-to-date.  Dr. Delorse Lek, Allergist, has prescribed Singulair for her to take 10 mg daily as well as Carbinoxamine maleate 4 mg 2 tabs twice daily for allergy symptoms  She takes Neurontin 300 mg at bedtime per Dr. Terrace Arabia for chronic itching of upper back.    Recently had mammogram. Have ordered bone density. Needs screening colonoscopy.   Review of Systems  Had mammogram at [facility] 2021. Needs colonoscopy      Objective:   Physical Exam Blood pressure 90/60 pulse 84 pulse oximetry 98% weight 125 pounds height 5 feet 9 inches BMI 20.80  Skin: Warm and dry.  No cervical adenopathy.  No thyromegaly.  Chest is clear to auscultation.  Breast are without masses.  Cardiac exam: Regular rate and rhythm.  Abdomen is soft nondistended without hepatosplenomegaly masses or tenderness.  No edema of the lower extremities.   Neuro is intact without focal deficits.  Affect thought and judgment are normal.  Had Pap in 2020 and will be due in 2023.       Assessment & Plan:  Chronic itching of upper back treated by Dr. Terrace Arabia with Neurontin.  History of allergic rhinitis seen by Dr. Delorse Lek treated with Singulair and antihistamine.  Was allergic to mites,  feathers, trees, grasses, weeds including ragweed, some molds,  History of COVID-19 in August 2021.  Has had 2 vaccines since then.  History of vitamin D deficiency 2 years ago and level was 25.  Level was normal in 2021.  Level is 38 at present time.  Health maintenance-needs bone density study and colonoscopy.  Orders will be placed.  Plan: Return in 1 year or as needed.  Take vitamin D supplementation and continue medications per allergist as well as Dr. Terrace Arabia for itching.  Return in 1 year or as needed.

## 2020-11-20 NOTE — Patient Instructions (Signed)
Please have bone density study if you have not already done so.  Referral to be made for screening colonoscopy.  Continue current medications.  Continue vitamin D supplement.  Return in 1 year or as needed.

## 2020-11-26 ENCOUNTER — Ambulatory Visit: Payer: BC Managed Care – PPO | Admitting: Allergy

## 2020-11-29 ENCOUNTER — Other Ambulatory Visit: Payer: Self-pay

## 2020-11-29 DIAGNOSIS — E2839 Other primary ovarian failure: Secondary | ICD-10-CM

## 2020-12-03 LAB — HM DEXA SCAN: HM Dexa Scan: 2.4

## 2020-12-06 ENCOUNTER — Telehealth: Payer: Self-pay | Admitting: Internal Medicine

## 2020-12-06 ENCOUNTER — Encounter: Payer: Self-pay | Admitting: Internal Medicine

## 2020-12-06 DIAGNOSIS — M85852 Other specified disorders of bone density and structure, left thigh: Secondary | ICD-10-CM

## 2020-12-06 NOTE — Telephone Encounter (Signed)
Phone call from patient regarding billing for recent visit. Explained modifier added for discussing additional problems. Also, bone density results cam in today. She has osteopenia with lowest score -2.4 in left femoral neck. Advised Endocrine consult. She wants to see Dr. Ocie Cornfield in [facility].

## 2020-12-07 ENCOUNTER — Other Ambulatory Visit: Payer: Self-pay

## 2020-12-07 ENCOUNTER — Encounter: Payer: Self-pay | Admitting: Internal Medicine

## 2020-12-07 DIAGNOSIS — M8580 Other specified disorders of bone density and structure, unspecified site: Secondary | ICD-10-CM

## 2020-12-09 NOTE — Telephone Encounter (Signed)
Placed referral  

## 2020-12-31 ENCOUNTER — Encounter: Payer: Self-pay | Admitting: Allergy

## 2020-12-31 ENCOUNTER — Ambulatory Visit (INDEPENDENT_AMBULATORY_CARE_PROVIDER_SITE_OTHER): Payer: BC Managed Care – PPO | Admitting: Allergy

## 2020-12-31 ENCOUNTER — Other Ambulatory Visit: Payer: Self-pay | Admitting: Allergy

## 2020-12-31 ENCOUNTER — Other Ambulatory Visit: Payer: Self-pay

## 2020-12-31 VITALS — BP 100/80 | HR 85 | Temp 97.4°F | Resp 16 | Ht 65.0 in | Wt 125.6 lb

## 2020-12-31 DIAGNOSIS — J3089 Other allergic rhinitis: Secondary | ICD-10-CM

## 2020-12-31 DIAGNOSIS — R0982 Postnasal drip: Secondary | ICD-10-CM | POA: Diagnosis not present

## 2020-12-31 MED ORDER — CARBINOXAMINE MALEATE 6 MG PO TABS: 6.0000 mg | ORAL_TABLET | Freq: Two times a day (BID) | ORAL | 1 refills | Status: DC

## 2020-12-31 MED ORDER — CARBINOXAMINE MALEATE 6 MG PO TABS: 6.0000 mg | ORAL_TABLET | Freq: Two times a day (BID) | ORAL | 5 refills | Status: DC

## 2020-12-31 MED ORDER — MONTELUKAST SODIUM 10 MG PO TABS: 10.0000 mg | ORAL_TABLET | Freq: Every day | ORAL | 1 refills | Status: DC

## 2020-12-31 MED ORDER — MOMETASONE FUROATE 50 MCG/ACT NA SUSP: 2.0000 | Freq: Every day | NASAL | 1 refills | Status: DC

## 2020-12-31 NOTE — Progress Notes (Signed)
Follow-up Note  RE: Susan Massey MRN: [id number] DOB: Sep 14, 1970 Date of Office Visit: 12/31/2020   History of present illness: Susan Massey is a 50 y.o. female presenting today for follow-up of allergic rhinitis.  She was last seen in the office on 07/22/2020 by myself.  She states she is doing better since this last visit.  She states the carbinoxamine and Singulair do seem to be helpful as she is noting less drainage and less throat clearing.  She states her symptoms are not as bad as they were.  She states her husband has noted that she is not throat clearing is much.  Her insurance did not cover branded RyVent 6 mg tablets that she is doing carbinoxamine 4 mg tablets taking 2 tablets twice a day with the 1 tablet singular daily.  She did try the nasal Atrovent but states she feels like it dried out the nose too much and she would have dried blood when she would blow her nose.  She is not sure if she could tell any significant benefit with the Atrovent use or not.  She has tried nasal steroids in the past however unclear if it seemed to be helpful.  She has performed nasal saline rinses but not consistently.  Review of systems: Review of Systems  Constitutional: Negative.   HENT:         See HPI  Eyes: Negative.   Respiratory: Negative.    Cardiovascular: Negative.   Gastrointestinal: Negative.   Musculoskeletal: Negative.   Skin: Negative.   Neurological: Negative.    All other systems negative unless noted above in HPI  Past medical/social/surgical/family history have been reviewed and are unchanged unless specifically indicated below.  No changes  Medication List: Current Outpatient Medications  Medication Sig Dispense Refill   ascorbic acid (VITAMIN C) 250 MG tablet      Calcium Carb-Cholecalciferol (CALCIUM 500 + D PO) Take by mouth.     Carbinoxamine Maleate 4 MG TABS Take 8mg  2 times daily as needed for allergy symptoms. (Patient taking differently: Take 2  TABLETS 2 times daily as needed for allergy symptoms.) 360 tablet 1   Melatonin 10 MG TABS Take by mouth.     mometasone (NASONEX) 50 MCG/ACT nasal spray Place 2 sprays into the nose daily. Two sprays each in each nostril 51 g 1   Multiple Vitamin (MULTIVITAMIN ADULT PO) Take by mouth.     Carbinoxamine Maleate (RYVENT) 6 MG TABS Take 6 mg by mouth 2 (two) times daily. 60 tablet 5   montelukast (SINGULAIR) 10 MG tablet Take 1 tablet (10 mg total) by mouth at bedtime. 90 tablet 1   No current facility-administered medications for this visit.     Known medication allergies: Allergies  Allergen Reactions   Sulfa Antibiotics Swelling   Penicillins Rash   Tramadol Other (See Comments) and Nausea And Vomiting    Other reaction(s): Vomiting   Latex Rash and Other (See Comments)     Physical examination: Blood pressure 100/80, pulse 85, temperature (!) 97.4 F (36.3 C), temperature source Temporal, resp. rate 16, height 5\' 5"  (1.651 m), weight 125 lb 9.6 oz (57 kg), SpO2 99 %.  General: Alert, interactive, in no acute distress. HEENT: PERRLA, TMs pearly gray, turbinates mildly edematous with clear discharge, post-pharynx non erythematous. Neck: Supple without lymphadenopathy. Lungs: Clear to auscultation without wheezing, rhonchi or rales. {no increased work of breathing. CV: Normal S1, S2 without murmurs. Abdomen: Nondistended, nontender. Skin: Warm and dry, without  lesions or rashes. Extremities:  No clubbing, cyanosis or edema. Neuro:   Grossly intact.  Diagnositics/Labs: None today  Assessment and plan: Allergic rhinitis with postnasal drip component -continue avoidance measures for grasses, weeds, trees, molds, dust mites, mixed feathers, cockroach, mouse.   -allergen avoidance measures discussed/handouts provided -Can use Atrovent 2 sprays each nostril as needed up to 3-4 times a day for nasal drainage.   -Continue carbinoxamine 4mg  2 tab twice a day (max dosing 16 mg twice a  day).   -Continue Singulair (antileukotriene) 10mg  at bedtime.   -allergen immunotherapy has been previously discussed including protocol, benefits and risk.  Informational handout provided previously.  If interested in this therapuetic option you can check with your insurance carrier for coverage.  Let know if you would like to proceed with this option.    Follow-up in 4 months or sooner if needed   I appreciate the opportunity to take part in Susan's care. Please do not hesitate to contact me with questions.  Sincerely,   , MD Allergy/Immunology [facility]

## 2020-12-31 NOTE — Patient Instructions (Addendum)
-  continue avoidance measures for grasses, weeds, trees, molds, dust mites, mixed feathers, cockroach, mouse.   -allergen avoidance measures discussed/handouts provided -Can use Atrovent 2 sprays each nostril as needed up to 3-4 times a day for nasal drainage.   -Continue carbinoxamine 4mg  2 tab twice a day (max dosing 16 mg twice a day).   -Continue Singulair (antileukotriene) 10mg  at bedtime.   -allergen immunotherapy has been previously discussed including protocol, benefits and risk.  Informational handout provided previously.  If interested in this therapuetic option you can check with your insurance carrier for coverage.  Let know if you would like to proceed with this option.    Follow-up in 4 months or sooner if needed

## 2021-01-05 ENCOUNTER — Other Ambulatory Visit: Payer: Self-pay

## 2021-01-05 MED ORDER — CARBINOXAMINE MALEATE 4 MG PO TABS: ORAL_TABLET | ORAL | 1 refills | Status: DC

## 2021-01-05 MED ORDER — CARBINOXAMINE MALEATE 4 MG PO TABS: 8.0000 mg | ORAL_TABLET | Freq: Two times a day (BID) | ORAL | 5 refills | Status: DC

## 2021-01-05 NOTE — Telephone Encounter (Signed)
Redgie Grayer has already taken care of this for [place]

## 2021-01-05 NOTE — Telephone Encounter (Signed)
[facility] called to get last office visit note, diagnotic codes, trail & error meds, allergies, and medication list. Fax to [phone].

## 2021-03-11 LAB — HM COLONOSCOPY

## 2021-05-06 ENCOUNTER — Telehealth: Payer: Self-pay | Admitting: Neurology

## 2021-05-06 DIAGNOSIS — W19XXXS Unspecified fall, sequela: Secondary | ICD-10-CM

## 2021-05-06 DIAGNOSIS — W19XXXA Unspecified fall, initial encounter: Secondary | ICD-10-CM | POA: Insufficient documentation

## 2021-05-06 MED ORDER — CELECOXIB 100 MG PO CAPS: 100.0000 mg | ORAL_CAPSULE | Freq: Two times a day (BID) | ORAL | 3 refills | Status: DC

## 2021-05-06 NOTE — Telephone Encounter (Signed)
Patient fell, has direct impact to the coccygeal, this been ongoing for 5 days, she still have significant coccygeal pain, worsening pain with deep palpitation, history of osteoporosis  X-ray of sacral/coccygeal  GI side effect with frequent ibuprofen use, Celebrex 100 mg twice a day as needed

## 2021-05-09 ENCOUNTER — Ambulatory Visit
Admission: RE | Admit: 2021-05-09 | Discharge: 2021-05-09 | Disposition: A | Discharge status: Home / Self Care | Payer: BC Managed Care – PPO | Source: Ambulatory Visit | Attending: Neurology | Admitting: Neurology

## 2021-05-09 DIAGNOSIS — W19XXXS Unspecified fall, sequela: Secondary | ICD-10-CM

## 2021-07-08 ENCOUNTER — Ambulatory Visit: Payer: BC Managed Care – PPO | Admitting: Allergy

## 2021-07-18 ENCOUNTER — Other Ambulatory Visit: Payer: Self-pay | Admitting: Allergy

## 2021-08-19 ENCOUNTER — Other Ambulatory Visit: Payer: Self-pay

## 2021-08-19 ENCOUNTER — Ambulatory Visit: Payer: BC Managed Care – PPO | Admitting: Family Medicine

## 2021-08-19 ENCOUNTER — Ambulatory Visit: Payer: BC Managed Care – PPO | Admitting: Allergy

## 2021-08-19 ENCOUNTER — Encounter: Payer: Self-pay | Admitting: Family Medicine

## 2021-08-19 VITALS — BP 102/68 | HR 98 | Temp 98.0°F | Resp 17

## 2021-08-19 DIAGNOSIS — J302 Other seasonal allergic rhinitis: Secondary | ICD-10-CM | POA: Insufficient documentation

## 2021-08-19 DIAGNOSIS — J3089 Other allergic rhinitis: Secondary | ICD-10-CM

## 2021-08-19 MED ORDER — CARBINOXAMINE MALEATE 4 MG PO TABS: ORAL_TABLET | ORAL | 1 refills | Status: DC

## 2021-08-19 MED ORDER — MOMETASONE FUROATE 50 MCG/ACT NA SUSP: 2.0000 | Freq: Every day | NASAL | 1 refills | Status: DC

## 2021-08-19 MED ORDER — MONTELUKAST SODIUM 10 MG PO TABS: ORAL_TABLET | ORAL | 1 refills | Status: DC

## 2021-08-19 NOTE — Progress Notes (Signed)
[address] [place] [address] Dept: [phone]  FOLLOW UP NOTE  Patient ID: Susan Massey, female    DOB: 06-13-1971  Age: 51 y.o. MRN: [id number] Date of Office Visit: 08/19/2021  Assessment  Chief Complaint: Allergic Rhinitis   HPI Susan Massey is a 51 year old female who presents the clinic for follow-up visit.  She was last seen in this clinic on 12/31/2020 by Dr. Delorse Lek for evaluation of allergic rhinitis.  Her last environmental allergy testing was on 12/31/2020 and was positive to grass pollen, weed pollen, tree pollen, mold, dust mite, mixed feather, cockroach, and mouse.  At today's visit, she reports that her allergies have been moderately well controlled with throat clearing that began about 2 months ago.  She also reports of occasional clear rhinorrhea and nasal congestion.  She reports that her allergy symptoms had been well controlled until about 2 months ago.  She continues montelukast 10 mg once a day and carbinoxamine 4 4 mg tablets twice a day.  She continues saline nasal rinses on most days and occasionally uses Nasonex with moderate relief of symptoms.  Her last environmental allergy testing was on 07/22/2020 and was positive to grass pollen, weed pollen, tree pollen, mold, dust mite, cockroach, mixed feathers, and mouse.  Her current medications are listed in the chart.   Drug Allergies:  Allergies  Allergen Reactions   Sulfa Antibiotics Swelling   Penicillins Rash   Tramadol Other (See Comments) and Nausea And Vomiting    Other reaction(s): Vomiting   Latex Rash and Other (See Comments)    Physical Exam: BP 102/68    Pulse 98    Temp 98 F (36.7 C) (Temporal)    Resp 17    SpO2 99%    Physical Exam Vitals reviewed.  Constitutional:      Appearance: Normal appearance.  HENT:     Head: Normocephalic and atraumatic.     Right Ear: Tympanic membrane normal.     Left Ear: Tympanic membrane normal.     Nose:     Comments: Bilateral nares  slightly erythematous with clear nasal drainage noted.  Pharynx normal.  Ears normal.  Eyes normal.    Mouth/Throat:     Pharynx: Oropharynx is clear.  Eyes:     Conjunctiva/sclera: Conjunctivae normal.  Cardiovascular:     Rate and Rhythm: Normal rate and regular rhythm.     Heart sounds: Normal heart sounds. No murmur heard. Pulmonary:     Effort: Pulmonary effort is normal.     Breath sounds: Normal breath sounds.     Comments: Lungs clear to auscultation Musculoskeletal:        General: Normal range of motion.     Cervical back: Normal range of motion and neck supple.  Skin:    General: Skin is warm and dry.  Neurological:     Mental Status: She is alert and oriented to person, place, and time.  Psychiatric:        Mood and Affect: Mood normal.        Behavior: Behavior normal.        Thought Content: Thought content normal.        Judgment: Judgment normal.    Assessment and Plan: 1. Seasonal and perennial allergic rhinitis     Meds ordered this encounter  Medications   Carbinoxamine Maleate 4 MG TABS    Sig: TAKE 4 TABLETS TWICE DAILY    Dispense:  720 tablet    Refill:  1  montelukast (SINGULAIR) 10 MG tablet    Sig: TAKE 1 TABLET BY MOUTH EVERYDAY AT BEDTIME    Dispense:  90 tablet    Refill:  1   mometasone (NASONEX) 50 MCG/ACT nasal spray    Sig: Place 2 sprays into the nose daily. Two sprays each in each nostril    Dispense:  51 g    Refill:  1    Patient Instructions  Allergic rhinitis Continue allergen avoidance measures directed toward pollen, mold, dust mite, cockroach, feather, and mouse as listed below Continue montelukast 10 mg once a day for allergy symptom control Continue carbinoxamine 4 mg tablets-take 2 tablets up to twice a day as needed for nasal symptoms (max dose 16 mg twice a day). Remember to rotate to a different antihistamine about every 3 months. Some examples of over the counter antihistamines include Zyrtec (cetirizine), Xyzal  (levocetirizine), Allegra (fexofenadine), and Claritin (loratidine).  Begin Nasacort 2 sprays in each nostril once a day as needed for a stuffy nose.  In the right nostril, point the applicator out toward the right ear. In the left nostril, point the applicator out toward the left ear Continue ipratropium 2 sprays in each nostril twice a day as needed for runny nose Consider allergen immunotherapy if your symptoms are not managed well with the treatment plan as listed above.  Call the clinic if this treatment plan is not working well for you.  Follow up in 3 months or sooner if needed.   Return in about 3 months (around 11/17/2021), or if symptoms worsen or fail to improve.    Thank you for the opportunity to care for this patient.  Please do not hesitate to contact me with questions.  Thermon Leyland, FNP [facility]

## 2021-08-19 NOTE — Patient Instructions (Addendum)
Allergic rhinitis Continue allergen avoidance measures directed toward pollen, mold, dust mite, cockroach, feather, and mouse as listed below Continue montelukast 10 mg once a day for allergy symptom control Continue carbinoxamine 4 mg tablets-take 2 tablets up to twice a day as needed for nasal symptoms (max dose 16 mg twice a day). Remember to rotate to a different antihistamine about every 3 months. Some examples of over the counter antihistamines include Zyrtec (cetirizine), Xyzal (levocetirizine), Allegra (fexofenadine), and Claritin (loratidine).  Begin Nasacort 2 sprays in each nostril once a day as needed for a stuffy nose.  In the right nostril, point the applicator out toward the right ear. In the left nostril, point the applicator out toward the left ear Continue ipratropium 2 sprays in each nostril twice a day as needed for runny nose Consider allergen immunotherapy if your symptoms are not managed well with the treatment plan as listed above.  Call the clinic if this treatment plan is not working well for you.  Follow up in 3 months or sooner if needed.  Reducing Pollen Exposure The American Academy of Allergy, Asthma and Immunology suggests the following steps to reduce your exposure to pollen during allergy seasons. Do not hang sheets or clothing out to dry; pollen may collect on these items. Do not mow lawns or spend time around freshly cut grass; mowing stirs up pollen. Keep windows closed at night.  Keep car windows closed while driving. Minimize morning activities outdoors, a time when pollen counts are usually at their highest. Stay indoors as much as possible when pollen counts or humidity is high and on windy days when pollen tends to remain in the air longer. Use air conditioning when possible.  Many air conditioners have filters that trap the pollen spores. Use a HEPA room air filter to remove pollen form the indoor air you breathe.  Control of Mold Allergen Mold and  fungi can grow on a variety of surfaces provided certain temperature and moisture conditions exist.  Outdoor molds grow on plants, decaying vegetation and soil.  The major outdoor mold, Alternaria and Cladosporium, are found in very high numbers during hot and dry conditions.  Generally, a late Summer - Fall peak is seen for common outdoor fungal spores.  Rain will temporarily lower outdoor mold spore count, but counts rise rapidly when the rainy period ends.  The most important indoor molds are Aspergillus and Penicillium.  Dark, humid and poorly ventilated basements are ideal sites for mold growth.  The next most common sites of mold growth are the bathroom and the kitchen.  Outdoor [employer] Use air conditioning and keep windows closed Avoid exposure to decaying vegetation. Avoid leaf raking. Avoid grain handling. Consider wearing a face mask if working in moldy areas.  Indoor Mold Control Maintain humidity below 50%. Clean washable surfaces with 5% bleach solution. Remove sources e.g. Contaminated carpets.  Control of Cockroach Allergen Cockroach allergen has been identified as an important cause of acute attacks of asthma, especially in urban settings.  There are fifty-five species of cockroach that exist in the [place], however only three, the [place], [place] species produce allergen that can affect patients with Asthma.  Allergens can be obtained from fecal particles, egg casings and secretions from cockroaches.    Remove food sources. Reduce access to water. Seal access and entry points. Spray runways with 0.5-1% Diazinon or Chlorpyrifos Blow boric acid power under stoves and refrigerator. Place bait stations (hydramethylnon) at feeding sites.

## 2021-11-18 ENCOUNTER — Other Ambulatory Visit: Payer: BC Managed Care – PPO | Admitting: Internal Medicine

## 2021-11-25 ENCOUNTER — Encounter: Payer: BC Managed Care – PPO | Admitting: Internal Medicine

## 2022-01-01 IMAGING — CR DG SACRUM/COCCYX 2+V
3 series · 3 of 3 positions shown · non-contrast
Comparison: None.

CLINICAL DATA: sacrum bone pain

EXAM:
SACRUM AND COCCYX - 2+ VIEW

[t sacrum ap]
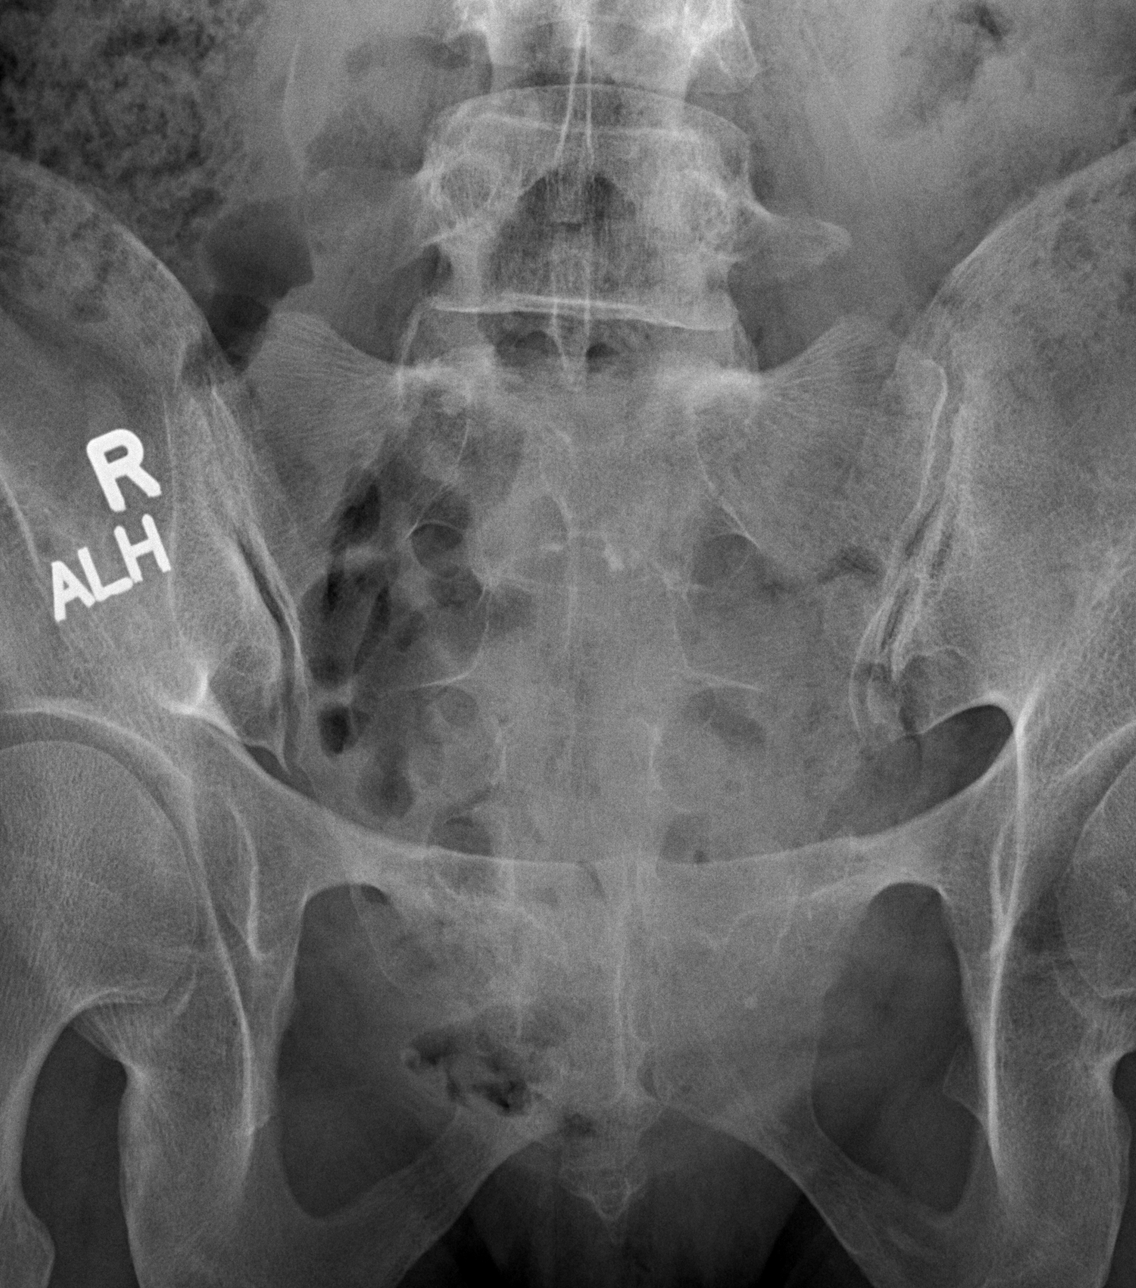

[t coccyx ap]
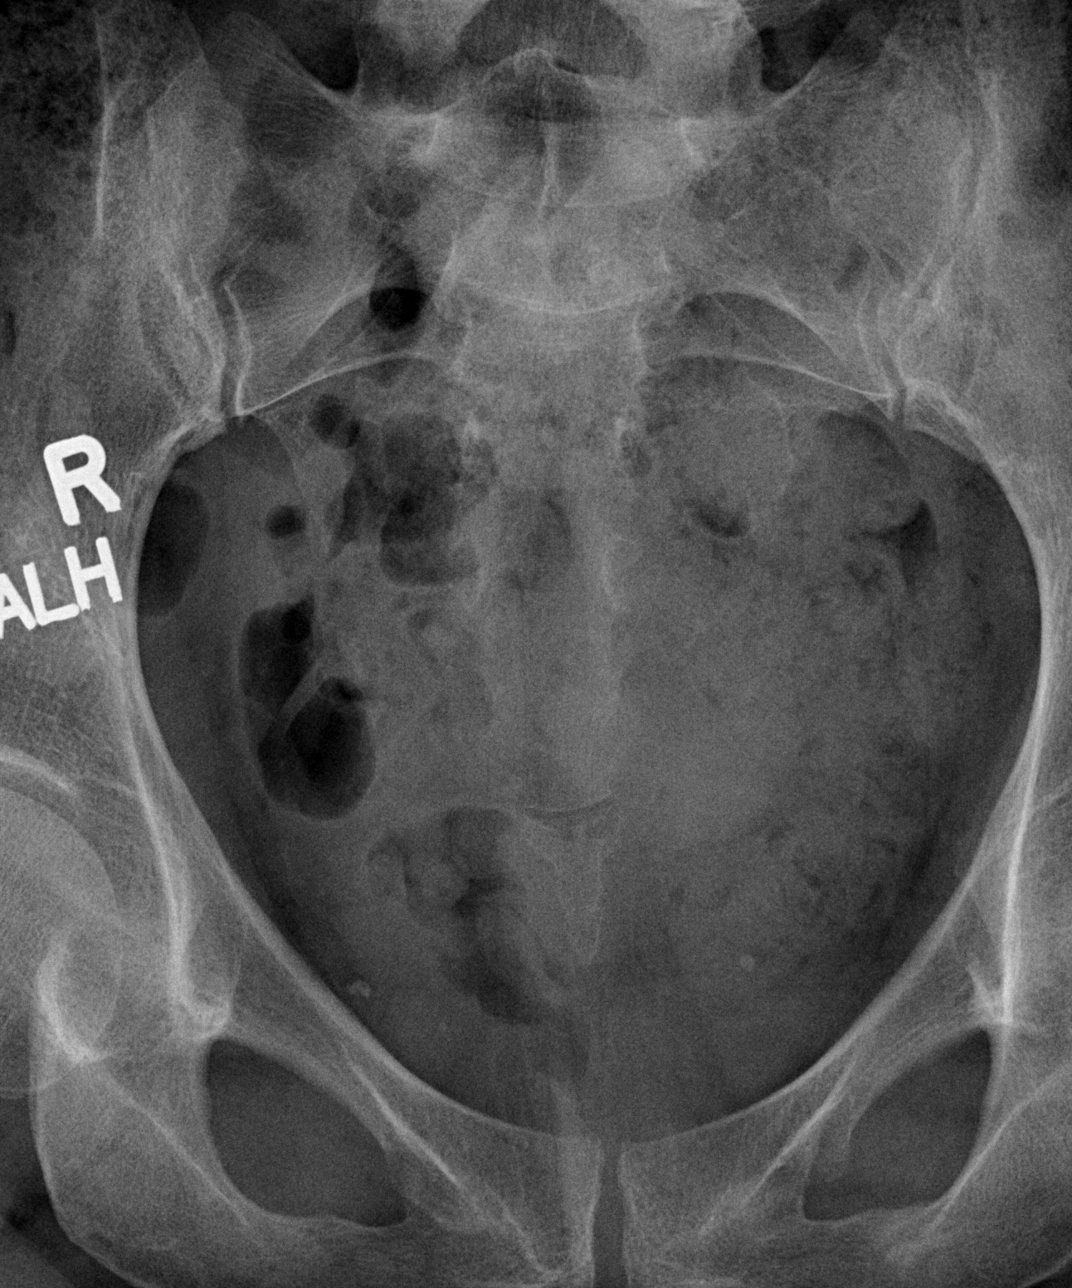

[t sacrum coccyx lat]
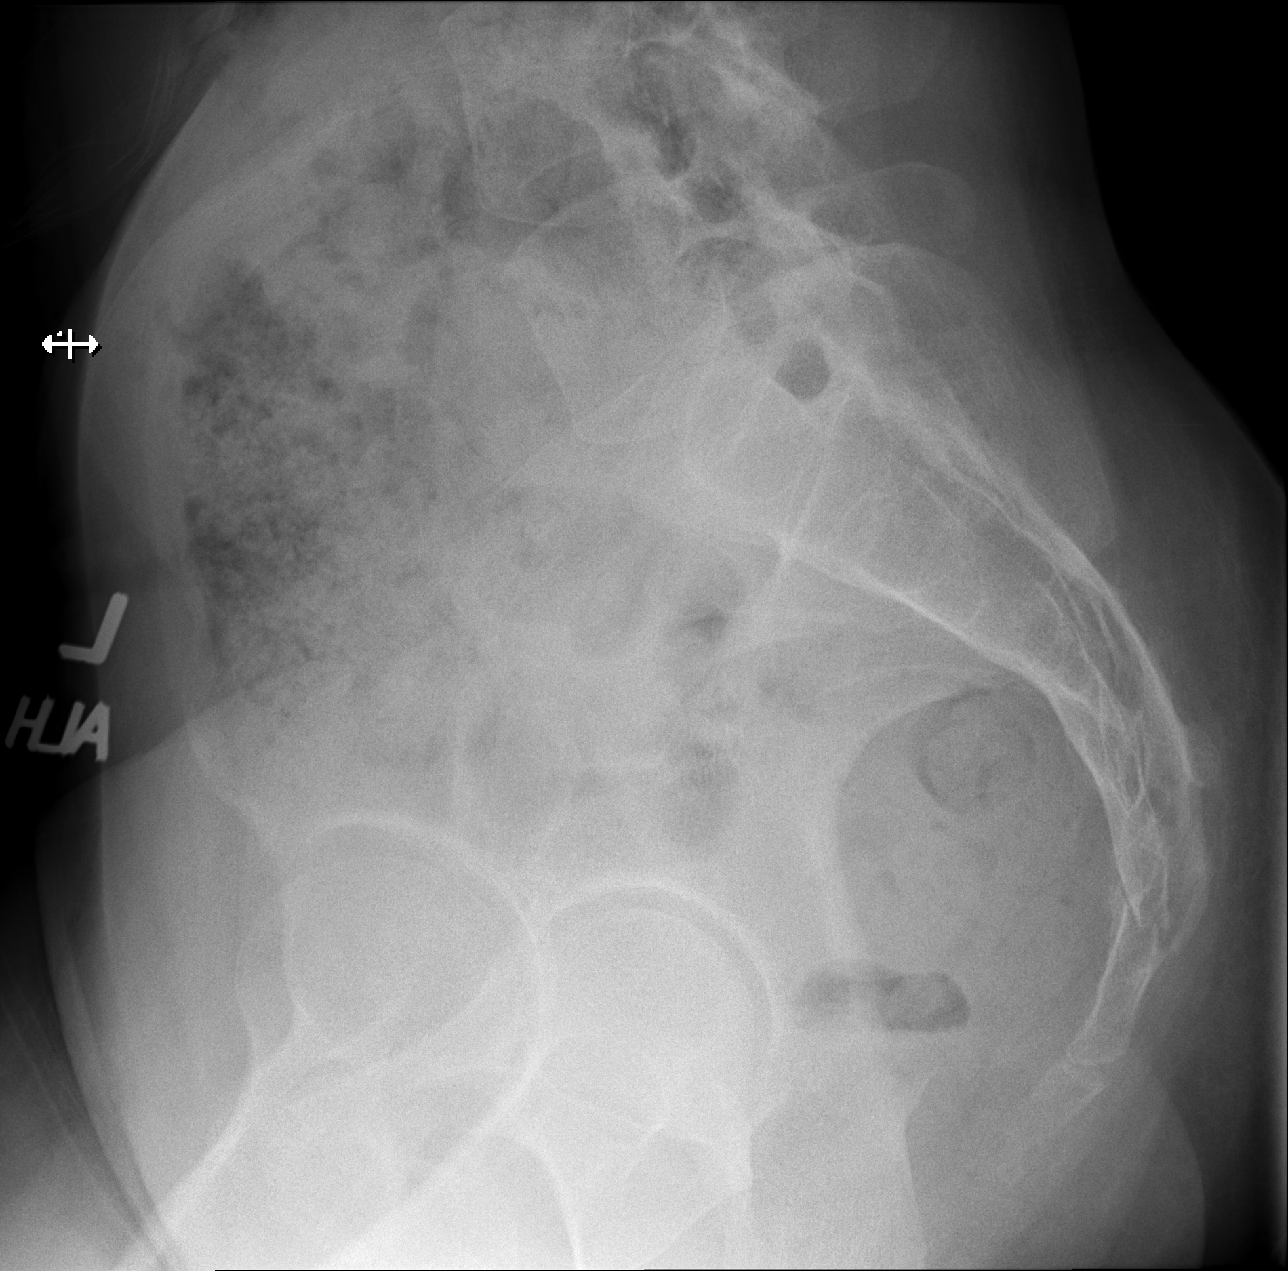

[3 of 3 positions shown; findings below may reference images not displayed]

FINDINGS: There is lucency traversing the coccyx on lateral radiograph. No
definitive sacral fracture identified. Pelvic phleboliths.
IMPRESSION: Lucency traversing the coccyx on lateral radiograph is most
consistent with a nondisplaced coccygeal fracture. Recommend
correlation with point tenderness.

## 2022-01-13 ENCOUNTER — Other Ambulatory Visit: Payer: BC Managed Care – PPO

## 2022-01-13 DIAGNOSIS — Z0000 Encounter for general adult medical examination without abnormal findings: Secondary | ICD-10-CM

## 2022-01-13 DIAGNOSIS — Z8639 Personal history of other endocrine, nutritional and metabolic disease: Secondary | ICD-10-CM

## 2022-01-13 DIAGNOSIS — Z136 Encounter for screening for cardiovascular disorders: Secondary | ICD-10-CM

## 2022-01-13 DIAGNOSIS — R5383 Other fatigue: Secondary | ICD-10-CM

## 2022-01-14 LAB — LIPID PANEL
Cholesterol: 211 mg/dL — ABNORMAL HIGH (ref <200)
HDL: 85 mg/dL (ref >=50)
LDL Cholesterol (Calc): 112 mg/dL (calc) — ABNORMAL HIGH
Non-HDL Cholesterol (Calc): 126 mg/dL (calc) (ref <130)
Total CHOL/HDL Ratio: 2.5 (calc) (ref <5.0)
Triglycerides: 53 mg/dL (ref <150)

## 2022-01-14 LAB — CBC WITH DIFFERENTIAL/PLATELET
Absolute Monocytes: 403 cells/uL (ref 200–950)
Basophils Absolute: 30 cells/uL (ref 0–200)
Basophils Relative: 0.8 %
Eosinophils Absolute: 72 cells/uL (ref 15–500)
Eosinophils Relative: 1.9 %
HCT: 39.7 % (ref 35.0–45.0)
Hemoglobin: 13.7 g/dL (ref 11.7–15.5)
Lymphs Abs: 1353 cells/uL (ref 850–3900)
MCH: 30.9 pg (ref 27.0–33.0)
MCHC: 34.5 g/dL (ref 32.0–36.0)
MCV: 89.4 fL (ref 80.0–100.0)
MPV: 11 fL (ref 7.5–12.5)
Monocytes Relative: 10.6 %
Neutro Abs: 1942 cells/uL (ref 1500–7800)
Neutrophils Relative %: 51.1 %
Platelets: 269 10*3/uL (ref 140–400)
RBC: 4.44 10*6/uL (ref 3.80–5.10)
RDW: 11.5 % (ref 11.0–15.0)
Total Lymphocyte: 35.6 %
WBC: 3.8 10*3/uL (ref 3.8–10.8)

## 2022-01-14 LAB — COMPLETE METABOLIC PANEL WITH GFR
AG Ratio: 1.7 (calc) (ref 1.0–2.5)
ALT: 15 U/L (ref 6–29)
AST: 18 U/L (ref 10–35)
Albumin: 4.5 g/dL (ref 3.6–5.1)
Alkaline phosphatase (APISO): 48 U/L (ref 37–153)
BUN: 18 mg/dL (ref 7–25)
CO2: 27 mmol/L (ref 20–32)
Calcium: 9.5 mg/dL (ref 8.6–10.4)
Chloride: 104 mmol/L (ref 98–110)
Creat: 0.65 mg/dL (ref 0.50–1.03)
Globulin: 2.6 g/dL (calc) (ref 1.9–3.7)
Glucose, Bld: 83 mg/dL (ref 65–99)
Potassium: 4.9 mmol/L (ref 3.5–5.3)
Sodium: 141 mmol/L (ref 135–146)
Total Bilirubin: 0.6 mg/dL (ref 0.2–1.2)
Total Protein: 7.1 g/dL (ref 6.1–8.1)
eGFR: 106 mL/min/{1.73_m2} (ref >=60)

## 2022-01-14 LAB — TSH: TSH: 1.33 mIU/L

## 2022-01-14 LAB — VITAMIN D 25 HYDROXY (VIT D DEFICIENCY, FRACTURES): Vit D, 25-Hydroxy: 52 ng/mL (ref 30–100)

## 2022-01-20 ENCOUNTER — Ambulatory Visit (INDEPENDENT_AMBULATORY_CARE_PROVIDER_SITE_OTHER): Payer: BC Managed Care – PPO | Admitting: Internal Medicine

## 2022-01-20 ENCOUNTER — Other Ambulatory Visit (HOSPITAL_COMMUNITY)
Admission: RE | Admit: 2022-01-20 | Discharge: 2022-01-20 | Disposition: A | Discharge status: Home / Self Care | Payer: BC Managed Care – PPO | Source: Ambulatory Visit | Attending: Internal Medicine | Admitting: Internal Medicine

## 2022-01-20 ENCOUNTER — Encounter: Payer: Self-pay | Admitting: Internal Medicine

## 2022-01-20 VITALS — BP 98/60 | HR 73 | Temp 98.7°F | Ht 65.0 in | Wt 127.5 lb

## 2022-01-20 DIAGNOSIS — E782 Mixed hyperlipidemia: Secondary | ICD-10-CM

## 2022-01-20 DIAGNOSIS — Z7189 Other specified counseling: Secondary | ICD-10-CM

## 2022-01-20 DIAGNOSIS — Z0000 Encounter for general adult medical examination without abnormal findings: Secondary | ICD-10-CM | POA: Diagnosis not present

## 2022-01-20 DIAGNOSIS — J301 Allergic rhinitis due to pollen: Secondary | ICD-10-CM

## 2022-01-20 DIAGNOSIS — Z124 Encounter for screening for malignant neoplasm of cervix: Secondary | ICD-10-CM

## 2022-01-20 DIAGNOSIS — Z8639 Personal history of other endocrine, nutritional and metabolic disease: Secondary | ICD-10-CM | POA: Diagnosis not present

## 2022-01-20 DIAGNOSIS — J3089 Other allergic rhinitis: Secondary | ICD-10-CM

## 2022-01-20 DIAGNOSIS — H903 Sensorineural hearing loss, bilateral: Secondary | ICD-10-CM

## 2022-01-20 LAB — POCT URINALYSIS DIPSTICK
Bilirubin, UA: NEGATIVE
Blood, UA: NEGATIVE
Glucose, UA: NEGATIVE
Ketones, UA: NEGATIVE
Leukocytes, UA: NEGATIVE
Nitrite, UA: NEGATIVE
Protein, UA: NEGATIVE
Spec Grav, UA: 1.015 (ref 1.010–1.025)
Urobilinogen, UA: 0.2 E.U./dL
pH, UA: 6 (ref 5.0–8.0)

## 2022-01-20 NOTE — Progress Notes (Signed)
   Subjective:    Patient ID: Susan Massey, female    DOB: 25-Apr-1971, 51 y.o.   MRN: [id number]  HPI 51 year old Female seen for health maintenance exam.  In December 2022 she was seen by Dr. Annalee Genta regarding hearing loss.  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  Left ear was worse than right ear.  Audiology was suggested for hearing amplification.  History of deviated nasal septum evaluated by him 2 years previously.  Has been evaluated by allergist and started on topical nasal steroids and antihistamines.  Family history of premature hearing loss in maternal grandmother and patient's father who now has bilateral cochlear implants.  Past medical history: History of appendectomy and history of tonsillectomy.  History of uterine ablation, ganglion cyst removed, meniscal tear repair.  Social history: Married.  Does not smoke.  Family history: Remarkable for hypertension and hyperlipidemia as well as diabetes in her father.  Maternal grandmother had stroke and Alzheimer's disease.  Paternal grandfather had diabetes.  Maternal grandfather had COPD.  Maternal grandmother had cancer.  Takes Neurontin 300 mg at bedtime per Dr. Terrace Arabia for chronic itching of her upper back.    Review of Systems patient given written order for coronary calcium scoring. Family Hx CAD and stent     Objective:   Physical Exam Blood pressure 98/60 pulse 73 pulse oximetry 98% temp 98.7 degrees weight 127 pounds 8 ounces height 5 feet 5 inches BMI 21.22  Skin: Warm and dry.  No cervical adenopathy.  No thyromegaly.  Chest clear.  Cardiac exam: Regular rate and rhythm.  Abdomen soft nondistended without hepatosplenomegaly masses or tenderness.  Breast are without masses.  Pap taken.  Bimanual normal.  No lower extremity pitting edema.  Neuro is intact without significant deficits.  Hearing not evaluated.  Labs are within normal limits with the exception of total  cholesterol of 211 and LDL cholesterol of 112.  A year ago, lipids were normal.  We discussed coronary calcium scoring and order was placed.       Assessment & Plan:  Bilateral hearing loss seen by Dr. Annalee Genta.  Also seen by audiologist.  Allergic rhinitis seen by allergist and treated with Nasonex and Singulair  Estrogen replacement therapy with Vivelle-Dot and Prometrium  Mild hyperlipidemia-coronary calcium scoring ordered  Is a candidate for pneumococcal 20 vaccine  Tetanus immunization is up-to-date  Also a candidate for Shingrix vaccines  Needs to have screening colonoscopy  History of vitamin D deficiency-level is within normal limits on vitamin D supplement  Had mammogram in April 2023.  Needs to have bone density study.  This has been ordered.  Plan: Continue Singulair and Nasonex as previously ordered.  May continue with estrogen replacement with Vivelle-Dot and Prometrium.  Needs to continue vitamin D supplement.  Needs bone density study.  Have ordered coronary calcium study.  Needs colonoscopy.  Return in 1 year or as needed.

## 2022-01-23 LAB — CYTOLOGY - PAP
Adequacy: ABSENT
Diagnosis: NEGATIVE

## 2022-02-20 NOTE — Patient Instructions (Signed)
It was a pleasure to see you today.  Continue vitamin D supplement.  Continue estrogen replacement therapy.  Coronary calcium scoring ordered.  May need to be on lipid-lowering medication.  Continue to see allergist for allergy issues and may continue with Nasonex and Singulair.  Reviewed Dr. Thurmon Fair notes regarding hearing loss.  You are also eligible for Shingrix vaccines and pneumococcal 20 vaccine.  Need to have screening colonoscopy.  Return in 1 year or as needed.

## 2022-03-14 ENCOUNTER — Telehealth: Payer: Self-pay | Admitting: Internal Medicine

## 2022-03-14 NOTE — Telephone Encounter (Signed)
Sent email to [employer]

## 2022-03-14 NOTE — Telephone Encounter (Signed)
Susan Massey is calling asking why she was billed for an office visit during her annual physical, she stated she didn't remember going over anything but maybe some labs about cholesterol.

## 2022-05-19 ENCOUNTER — Institutional Professional Consult (permissible substitution): Payer: BC Managed Care – PPO | Admitting: Plastic Surgery

## 2022-07-27 ENCOUNTER — Other Ambulatory Visit: Payer: Self-pay | Admitting: Family Medicine

## 2022-07-28 ENCOUNTER — Other Ambulatory Visit: Payer: Self-pay | Admitting: Allergy

## 2022-12-11 LAB — HM DEXA SCAN

## 2022-12-11 LAB — HM MAMMOGRAPHY

## 2022-12-12 ENCOUNTER — Encounter: Payer: Self-pay | Admitting: Internal Medicine

## 2022-12-14 ENCOUNTER — Encounter: Payer: Self-pay | Admitting: Internal Medicine

## 2022-12-27 ENCOUNTER — Telehealth: Payer: Self-pay | Admitting: Internal Medicine

## 2022-12-27 DIAGNOSIS — R1013 Epigastric pain: Secondary | ICD-10-CM

## 2022-12-27 NOTE — Telephone Encounter (Signed)
Susan Massey [phone]  [name] called to say for last couple of weeks she has had at least 4 days that she has had nausea that has lasted for several hours for know reason, the first time she thought she might have a bug, but this has happened several times since that is upsetting her daily activities, she is also have spells of heartburn. She would like to come in and see you, she stated no rush.

## 2022-12-27 NOTE — Telephone Encounter (Signed)
scheduled

## 2022-12-29 ENCOUNTER — Ambulatory Visit: Payer: BC Managed Care – PPO | Admitting: Internal Medicine

## 2022-12-29 ENCOUNTER — Encounter: Payer: Self-pay | Admitting: Internal Medicine

## 2022-12-29 VITALS — BP 102/68 | HR 83 | Temp 95.9°F | Ht 65.0 in | Wt 129.2 lb

## 2022-12-29 DIAGNOSIS — M818 Other osteoporosis without current pathological fracture: Secondary | ICD-10-CM | POA: Diagnosis not present

## 2022-12-29 DIAGNOSIS — R110 Nausea: Secondary | ICD-10-CM | POA: Diagnosis not present

## 2022-12-29 DIAGNOSIS — R1013 Epigastric pain: Secondary | ICD-10-CM | POA: Diagnosis not present

## 2022-12-29 DIAGNOSIS — E782 Mixed hyperlipidemia: Secondary | ICD-10-CM | POA: Diagnosis not present

## 2022-12-29 DIAGNOSIS — Z0000 Encounter for general adult medical examination without abnormal findings: Secondary | ICD-10-CM | POA: Diagnosis not present

## 2022-12-29 DIAGNOSIS — K2100 Gastro-esophageal reflux disease with esophagitis, without bleeding: Secondary | ICD-10-CM | POA: Diagnosis not present

## 2022-12-29 DIAGNOSIS — H903 Sensorineural hearing loss, bilateral: Secondary | ICD-10-CM

## 2022-12-29 DIAGNOSIS — Z1329 Encounter for screening for other suspected endocrine disorder: Secondary | ICD-10-CM | POA: Diagnosis not present

## 2022-12-29 DIAGNOSIS — J301 Allergic rhinitis due to pollen: Secondary | ICD-10-CM

## 2022-12-29 DIAGNOSIS — E559 Vitamin D deficiency, unspecified: Secondary | ICD-10-CM

## 2022-12-29 DIAGNOSIS — Z8639 Personal history of other endocrine, nutritional and metabolic disease: Secondary | ICD-10-CM | POA: Diagnosis not present

## 2022-12-29 DIAGNOSIS — J3089 Other allergic rhinitis: Secondary | ICD-10-CM

## 2022-12-29 LAB — CBC WITH DIFFERENTIAL/PLATELET
Absolute Monocytes: 429 cells/uL (ref 200–950)
Basophils Absolute: 32 cells/uL (ref 0–200)
HCT: 39.9 % (ref 35.0–45.0)
MCH: 30.3 pg (ref 27.0–33.0)
MCV: 90.9 fL (ref 80.0–100.0)
MPV: 11.2 fL (ref 7.5–12.5)
Total Lymphocyte: 27.4 %
WBC: 5.3 10*3/uL (ref 3.8–10.8)

## 2022-12-29 MED ORDER — ONDANSETRON HCL 4 MG PO TABS: 4.0000 mg | ORAL_TABLET | Freq: Three times a day (TID) | ORAL | 1 refills | Status: DC | PRN

## 2022-12-29 MED ORDER — FAMOTIDINE 20 MG PO TABS
20.0000 mg | ORAL_TABLET | Freq: Two times a day (BID) | ORAL | 0 refills | Status: DC
Start: 2022-12-29 — End: 2023-02-09

## 2022-12-29 NOTE — Progress Notes (Signed)
Patient Care Team: Margaree Mackintosh, MD as PCP - General (Internal Medicine)  Visit Date: 12/29/22  Subjective:    Patient ID: Susan Massey , Female   DOB: 1971/05/03, 52 y.o.    MRN: [id number]    Pleasant 52 year old Female presents for healthcare maintenance exam and also with   several episodes of nausea, heartburn since 11/28/22. Denies vomiting, constipation, diarrhea, urinary symptoms, fever, chills. Heartburn is in upper chest. Episodes last several hours. No apparent association with foods. Has not had increased stress. Taking famotidine. Hx sensorineural hearing loss followed by ENT and wears bilateral hearing aids.  Past Medical History:  Diagnosis Date   Allergy    Heart murmur   History of appendectomy and history of tonsillectomy.  History of uterine ablation and ganglion cyst removed.  Meniscal tear repair.  Family History  Problem Relation Age of Onset   Hyperlipidemia Father    Hypertension Father    Diabetes Father    Hypertension Brother    Cancer Maternal Grandmother    COPD Maternal Grandfather    Diabetes Maternal Grandfather    Stroke Paternal Grandmother    Alzheimer's disease Paternal Grandmother    Diabetes Paternal Grandfather     Social Hx: Married. nonsmoker     Review of Systems  Constitutional:  Negative for chills, fever and malaise/fatigue.  HENT:  Negative for congestion.   Eyes:  Negative for blurred vision.  Respiratory:  Negative for cough and shortness of breath.   Cardiovascular:  Negative for chest pain, palpitations and leg swelling.  Gastrointestinal:  Positive for heartburn and nausea. Negative for constipation, diarrhea and vomiting.  Genitourinary:  Negative for dysuria, flank pain, frequency, hematuria and urgency.  Musculoskeletal:  Negative for back pain.  Skin:  Negative for rash.  Neurological:  Negative for loss of consciousness and headaches.        Objective:   Vitals: BP 102/68 (BP Location: Left Arm,  Patient Position: Sitting, Cuff Size: Normal)   Pulse 83   Temp (!) 95.9 F (35.5 C) (Tympanic)   Ht 5\' 5"  (1.651 m)   Wt 129 lb 4 oz (58.6 kg)   SpO2 96%   BMI 21.51 kg/m    Physical Exam Vitals and nursing note reviewed.  Constitutional:      General: She is not in acute distress.    Appearance: Normal appearance. She is not toxic-appearing.  HENT:     Head: Normocephalic and atraumatic.  Cardiovascular:     Rate and Rhythm: Normal rate and regular rhythm. No extrasystoles are present.    Pulses: Normal pulses.     Heart sounds: Normal heart sounds. No murmur heard.    No friction rub. No gallop.  Pulmonary:     Effort: Pulmonary effort is normal. No respiratory distress.     Breath sounds: Normal breath sounds. No wheezing or rales.  Abdominal:     General: Abdomen is flat. There is no distension.     Palpations: There is no hepatomegaly, splenomegaly or mass.     Tenderness: There is no abdominal tenderness.  Skin:    General: Skin is warm and dry.  Neurological:     Mental Status: She is alert and oriented to person, place, and time. Mental status is at baseline.  Psychiatric:        Mood and Affect: Mood normal.        Behavior: Behavior normal.        Thought Content: Thought content  normal.        Judgment: Judgment normal.       Results:   Studies obtained and personally reviewed by me:   Labs:       Component Value Date/Time   NA 141 01/13/2022 0927   K 4.9 01/13/2022 0927   CL 104 01/13/2022 0927   CO2 27 01/13/2022 0927   GLUCOSE 83 01/13/2022 0927   BUN 18 01/13/2022 0927   CREATININE 0.65 01/13/2022 0927   CALCIUM 9.5 01/13/2022 0927   PROT 7.1 01/13/2022 0927   AST 18 01/13/2022 0927   ALT 15 01/13/2022 0927   BILITOT 0.6 01/13/2022 0927   GFRNONAA 103 11/12/2020 0946   GFRAA 120 11/12/2020 0946     Lab Results  Component Value Date   WBC 3.8 01/13/2022   HGB 13.7 01/13/2022   HCT 39.7 01/13/2022   MCV 89.4 01/13/2022   PLT 269  01/13/2022    Lab Results  Component Value Date   CHOL 211 (H) 01/13/2022   HDL 85 01/13/2022   LDLCALC 112 (H) 01/13/2022   TRIG 53 01/13/2022   CHOLHDL 2.5 01/13/2022    No results found for: "HGBA1C"   Lab Results  Component Value Date   TSH 1.33 01/13/2022      Assessment & Plan:  Dyspepsia- Nausea, heartburn: ordered CPE labs plus amylase. Prescribed Zofran as needed, Pepcid 20 mg daily. Check-in with [place] in two weeks. Will consider upper abdominal ultrasound if symptoms do not improve.  Healthcare maintenance exam  Hx sensorineural hearing loss- seen by Dr. Annalee Genta and audiologist  Colonoscopy due- we can make referral when she is ready  History of allergic rhinitis and has been allergy tested in the past  Hx Vitamin D deficiency- continue supplement. Level is 37.  Osteoporosis T score -2.7 on recent bone density study.She is a candidate for bone sparing therapy such as Prolia or Boniva. Has seen Dr. Ocie Cornfield in the past and should re-contact her.  Reminded about Pneumococcal 20 vaccine and Shingrix vaccines  I,Alexander Ruley,acting as a scribe for Margaree Mackintosh, MD.,have documented all relevant documentation on the behalf of Margaree Mackintosh, MD,as directed by  Margaree Mackintosh, MD while in the presence of Margaree Mackintosh, MD.   I, Margaree Mackintosh, MD, have reviewed all documentation for this visit. The documentation on 01/14/23 for the exam, diagnosis, procedures, and orders are all accurate and complete.

## 2022-12-30 LAB — COMPLETE METABOLIC PANEL WITH GFR
AG Ratio: 2 (calc) (ref 1.0–2.5)
ALT: 12 U/L (ref 6–29)
AST: 16 U/L (ref 10–35)
Albumin: 4.9 g/dL (ref 3.6–5.1)
Alkaline phosphatase (APISO): 48 U/L (ref 37–153)
BUN: 19 mg/dL (ref 7–25)
CO2: 26 mmol/L (ref 20–32)
Calcium: 10.1 mg/dL (ref 8.6–10.4)
Chloride: 104 mmol/L (ref 98–110)
Creat: 0.74 mg/dL (ref 0.50–1.03)
Globulin: 2.4 g/dL (calc) (ref 1.9–3.7)
Glucose, Bld: 90 mg/dL (ref 65–99)
Potassium: 5.1 mmol/L (ref 3.5–5.3)
Sodium: 141 mmol/L (ref 135–146)
Total Bilirubin: 0.6 mg/dL (ref 0.2–1.2)
Total Protein: 7.3 g/dL (ref 6.1–8.1)
eGFR: 97 mL/min/{1.73_m2} (ref >=60)

## 2022-12-30 LAB — CBC WITH DIFFERENTIAL/PLATELET
Basophils Relative: 0.6 %
Eosinophils Absolute: 48 cells/uL (ref 15–500)
Eosinophils Relative: 0.9 %
Hemoglobin: 13.3 g/dL (ref 11.7–15.5)
Lymphs Abs: 1452 cells/uL (ref 850–3900)
MCHC: 33.3 g/dL (ref 32.0–36.0)
Monocytes Relative: 8.1 %
Neutro Abs: 3339 cells/uL (ref 1500–7800)
Neutrophils Relative %: 63 %
Platelets: 290 10*3/uL (ref 140–400)
RBC: 4.39 10*6/uL (ref 3.80–5.10)
RDW: 11.5 % (ref 11.0–15.0)

## 2022-12-30 LAB — LIPID PANEL
Cholesterol: 185 mg/dL (ref <200)
HDL: 77 mg/dL (ref >=50)
LDL Cholesterol (Calc): 93 mg/dL (calc)
Non-HDL Cholesterol (Calc): 108 mg/dL (calc) (ref <130)
Total CHOL/HDL Ratio: 2.4 (calc) (ref <5.0)
Triglycerides: 63 mg/dL (ref <150)

## 2022-12-30 LAB — AMYLASE: Amylase: 70 U/L (ref 21–101)

## 2022-12-30 LAB — VITAMIN D 25 HYDROXY (VIT D DEFICIENCY, FRACTURES): Vit D, 25-Hydroxy: 37 ng/mL (ref 30–100)

## 2022-12-30 LAB — TSH: TSH: 0.86 mIU/L

## 2023-01-14 DIAGNOSIS — H9193 Unspecified hearing loss, bilateral: Secondary | ICD-10-CM | POA: Insufficient documentation

## 2023-01-14 DIAGNOSIS — R1013 Epigastric pain: Secondary | ICD-10-CM | POA: Insufficient documentation

## 2023-01-14 DIAGNOSIS — M810 Age-related osteoporosis without current pathological fracture: Secondary | ICD-10-CM | POA: Insufficient documentation

## 2023-01-14 DIAGNOSIS — E559 Vitamin D deficiency, unspecified: Secondary | ICD-10-CM | POA: Insufficient documentation

## 2023-01-14 NOTE — Patient Instructions (Signed)
Try Pepcid and Zofran for dyspepsia and let us know if you are not better within 2 weeks.  We will consider upper abdominal ultrasound if symptoms do not improve.  Vitamin D level is low normal at 37.  Please continue supplement.  Bone density study consistent with osteoporosis.  Please speak with Dr. Roanna Raider about this.  Consider pneumococcal 20 and Shingrix vaccines.  It was a pleasure to see you today.

## 2023-01-15 MED ORDER — ESOMEPRAZOLE MAGNESIUM 40 MG PO CPDR: 40.0000 mg | DELAYED_RELEASE_CAPSULE | Freq: Every day | ORAL | 0 refills | Status: DC

## 2023-01-15 NOTE — Addendum Note (Signed)
Addended by: Jama Flavors on: 01/15/2023 04:43 PM   Modules accepted: Orders

## 2023-01-15 NOTE — Telephone Encounter (Signed)
Patient called back and said the nausea is a little better but said that the heart burn is worse. She has been taking famotidine (PEPCID) 20 MG tablet twice a day as prescribed but its not helping, She is taking pepto on top of that and its still not helping. She wasn't sure what you wanted her to try next. Call back is [phone]

## 2023-01-16 NOTE — Telephone Encounter (Signed)
Submitted pa for U/s waiting to hear back.

## 2023-01-18 ENCOUNTER — Encounter: Payer: Self-pay | Admitting: Internal Medicine

## 2023-01-18 ENCOUNTER — Ambulatory Visit
Admission: RE | Admit: 2023-01-18 | Discharge: 2023-01-18 | Disposition: A | Discharge status: Home / Self Care | Payer: BC Managed Care – PPO | Source: Ambulatory Visit | Attending: Internal Medicine | Admitting: Internal Medicine

## 2023-01-18 ENCOUNTER — Telehealth: Payer: Self-pay | Admitting: Internal Medicine

## 2023-01-18 DIAGNOSIS — R1013 Epigastric pain: Secondary | ICD-10-CM

## 2023-01-18 DIAGNOSIS — R1010 Upper abdominal pain, unspecified: Secondary | ICD-10-CM

## 2023-01-18 NOTE — Telephone Encounter (Signed)
Patient can get ultrasound of upper abdomen today if we place order. She prefers going today and is NPO. Order placed to [facility] [address] MJB, MD

## 2023-01-19 ENCOUNTER — Other Ambulatory Visit: Payer: BC Managed Care – PPO

## 2023-01-19 ENCOUNTER — Telehealth: Payer: Self-pay | Admitting: Internal Medicine

## 2023-01-19 NOTE — Telephone Encounter (Signed)
Hi Dr. Leonides Schanz,   Supervising Provider: 01/19/23-AM   Patient called requesting a transfer of care from [facility], stated she now resides in [place] and would like to continue her care with [facility]. The patient needs to be evaluated for heartburn and nausea has ramped up quite a bit. The burning discomfort is mainly in her stomach and throat, along with a bile taste. She has lost some weight unintentionally, most likely due to eating small portions of bland foods. She tried the Pepcid for a little more than two weeks. She has also taken an entire bottle of Pepto-Bismol. Tums are not really doing the trick. Zofran knocks down the nausea some but does not resolve it completely. She is on day three of the Nexium her PCP prescribed and hopeful that it will start to work. An abdomen ultrasound came back normal. She thinks it could be an H. Pylori infection or ulcer? The symptoms are really interfering with her ability to eat normally and affecting her daily quality of life. Her records are in [employer] and [employer] for you to review and advise on scheduling.   Thank you

## 2023-01-19 NOTE — Telephone Encounter (Signed)
Still awaiting approval. Urgent referral to gastro has been placed.

## 2023-01-26 ENCOUNTER — Other Ambulatory Visit: Payer: BC Managed Care – PPO

## 2023-02-01 NOTE — Progress Notes (Signed)
Patient Care Team: Margaree Mackintosh, MD as PCP - General (Internal Medicine)  Visit Date: 02/09/23  Subjective:    Patient ID: Susan Massey , Female   DOB: Jan 08, 1971, 52 y.o.    MRN: [id number]   52 y.o. Female presents today for annual comprehensive physical exam.  History of allergies treated with cetirizine 10 mg daily.  History of GERD treated with esomeprazole 40 mg daily. Requesting refill. Still having intermittent discomfort but no heartburn. No current symptoms. She is interested in getting a HIDA scan.  In December 2022 she was seen by Dr. Annalee Genta regarding hearing loss.  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  Left ear was worse than right ear.  Audiology was suggested for hearing amplification.  History of deviated nasal septum evaluated by him 2 years previously.  Has been evaluated by allergist and started on topical nasal steroids and antihistamines.  Family history of premature hearing loss in maternal grandmother and patient's father who now has bilateral cochlear implants.   Past medical history: History of appendectomy and history of tonsillectomy.  History of uterine ablation, ganglion cyst removed, meniscal tear repair.  Takes Neurontin 300 mg at bedtime per Dr. Terrace Arabia for chronic itching of her upper back.  Glucose normal. Kidney, liver functions normal. Electrolytes normal. Blood proteins normal. CBC normal. Lipid panel normal. TSH at 0.86. Amylase at 70. Vitamin D at 37.  Pap smear last completed 01/20/22. Negative for intraepithelial lesion or malignancy. Recommended repeat in 2026.   Mammogram last completed 12/11/22. No mammographic evidence of malignancy. Recommended repeat in 2025.  Due for colonoscopy.  T-score -2.7 at left femoral neck on 12/11/22.  Social history: Married.  Does not smoke.  Family history: Remarkable for hypertension and hyperlipidemia as well as diabetes in her father.  Maternal  grandmother had stroke and Alzheimer's disease.  Paternal grandfather had diabetes.  Maternal grandfather had COPD.  Maternal grandmother had cancer.  Past Medical History:  Diagnosis Date   Allergy    Heart murmur      Family History  Problem Relation Age of Onset   Hyperlipidemia Father    Hypertension Father    Diabetes Father    Hypertension Brother    Cancer Maternal Grandmother    COPD Maternal Grandfather    Diabetes Maternal Grandfather    Stroke Paternal Grandmother    Alzheimer's disease Paternal Grandmother    Diabetes Paternal Grandfather     Social History   Social History Narrative   Not on file      Review of Systems  Constitutional:  Negative for chills, fever, malaise/fatigue and weight loss.  HENT:  Negative for hearing loss, sinus pain and sore throat.   Respiratory:  Negative for cough, hemoptysis and shortness of breath.   Cardiovascular:  Negative for chest pain, palpitations, leg swelling and PND.  Gastrointestinal:  Positive for abdominal pain (RUQ intermittent). Negative for constipation, diarrhea, heartburn, nausea and vomiting.  Genitourinary:  Negative for dysuria, frequency and urgency.  Musculoskeletal:  Negative for back pain, myalgias and neck pain.  Skin:  Negative for itching and rash.  Neurological:  Negative for dizziness, tingling, seizures and headaches.  Endo/Heme/Allergies:  Negative for polydipsia.  Psychiatric/Behavioral:  Negative for depression. The patient is not nervous/anxious.         Objective:   Vitals: BP 100/68   Pulse 67   Resp 16   Ht 5\' 5"  (1.651 m)   Wt 126 lb  12 oz (57.5 kg)   LMP 11/12/2020   SpO2 97%   BMI 21.09 kg/m    Physical Exam Vitals and nursing note reviewed.  Constitutional:      General: She is not in acute distress.    Appearance: Normal appearance. She is not ill-appearing or toxic-appearing.  HENT:     Head: Normocephalic and atraumatic.     Right Ear: Hearing, tympanic membrane,  ear canal and external ear normal.     Left Ear: Hearing, tympanic membrane, ear canal and external ear normal.     Mouth/Throat:     Pharynx: Oropharynx is clear.  Eyes:     Extraocular Movements: Extraocular movements intact.     Pupils: Pupils are equal, round, and reactive to light.  Neck:     Thyroid: No thyroid mass, thyromegaly or thyroid tenderness.     Vascular: No carotid bruit.  Cardiovascular:     Rate and Rhythm: Normal rate and regular rhythm. No extrasystoles are present.    Pulses:          Dorsalis pedis pulses are 1+ on the right side and 1+ on the left side.     Heart sounds: Normal heart sounds. No murmur heard.    No friction rub. No gallop.  Pulmonary:     Effort: Pulmonary effort is normal.     Breath sounds: Normal breath sounds. No decreased breath sounds, wheezing, rhonchi or rales.  Chest:     Chest wall: No mass.  Abdominal:     Palpations: Abdomen is soft. There is no hepatomegaly, splenomegaly or mass.     Tenderness: There is no abdominal tenderness.     Hernia: No hernia is present.  Musculoskeletal:     Cervical back: Normal range of motion.     Right lower leg: No edema.     Left lower leg: No edema.  Lymphadenopathy:     Cervical: No cervical adenopathy.     Upper Body:     Right upper body: No supraclavicular adenopathy.     Left upper body: No supraclavicular adenopathy.  Skin:    General: Skin is warm and dry.  Neurological:     General: No focal deficit present.     Mental Status: She is alert and oriented to person, place, and time. Mental status is at baseline.     Sensory: Sensation is intact.     Motor: Motor function is intact. No weakness.     Deep Tendon Reflexes: Reflexes are normal and symmetric.  Psychiatric:        Attention and Perception: Attention normal.        Mood and Affect: Mood normal.        Speech: Speech normal.        Behavior: Behavior normal.        Thought Content: Thought content normal.         Cognition and Memory: Cognition normal.        Judgment: Judgment normal.       Results:   Studies obtained and personally reviewed by me:  Pap smear last completed 01/20/22. Negative for intraepithelial lesion or malignancy. Recommended repeat in 2026.   Mammogram last completed 12/11/22. No mammographic evidence of malignancy. Recommended repeat in 2025.  T-score -2.7 at left femoral neck on 12/11/22.  Labs:       Component Value Date/Time   NA 141 12/29/2022 1216   K 5.1 12/29/2022 1216   CL 104 12/29/2022 1216  CO2 26 12/29/2022 1216   GLUCOSE 90 12/29/2022 1216   BUN 19 12/29/2022 1216   CREATININE 0.74 12/29/2022 1216   CALCIUM 10.1 12/29/2022 1216   PROT 7.3 12/29/2022 1216   AST 16 12/29/2022 1216   ALT 12 12/29/2022 1216   BILITOT 0.6 12/29/2022 1216   GFRNONAA 103 11/12/2020 0946   GFRAA 120 11/12/2020 0946     Lab Results  Component Value Date   WBC 5.3 12/29/2022   HGB 13.3 12/29/2022   HCT 39.9 12/29/2022   MCV 90.9 12/29/2022   PLT 290 12/29/2022    Lab Results  Component Value Date   CHOL 185 12/29/2022   HDL 77 12/29/2022   LDLCALC 93 12/29/2022   TRIG 63 12/29/2022   CHOLHDL 2.4 12/29/2022    No results found for: "HGBA1C"   Lab Results  Component Value Date   TSH 0.86 12/29/2022      Assessment & Plan:   GERD/RUQ abdominal pain---still having intermittent right upper quadrant abdominal pain. Treated with esomeprazole 40 mg daily. Refilled. Ordered HIDA scan. She has a follow-up with Dr. Norwood Levo, gastroenterologist on 05/08/23.  Itching: treated with cetirizine 10 mg daily.  Takes Neurontin 300 mg at bedtime per Dr. Terrace Arabia for chronic itching of her upper back.  Urinalysis normal.  Pelvic and breast exams deferred to gynecologist.  Pap smear last completed 01/20/22. Negative for intraepithelial lesion or malignancy. Recommended repeat in 2026.   Mammogram last completed 12/11/22. No mammographic evidence of malignancy.  Recommended repeat in 2025.  Due for colonoscopy.  T-score -2.7 at left femoral neck on 12/11/22. Discussed treatment options.  Vaccine counseling: UTD on flu, tetanus vaccines.  Return in 1 year for health maintenance exam or as needed.    I,Alexander Ruley,acting as a Neurosurgeon for Margaree Mackintosh, MD.,have documented all relevant documentation on the behalf of Margaree Mackintosh, MD,as directed by  Margaree Mackintosh, MD while in the presence of Margaree Mackintosh, MD.   I, Margaree Mackintosh, MD, have reviewed all documentation for this visit. The documentation on 02/21/23 for the exam, diagnosis, procedures, and orders are all accurate and complete.

## 2023-02-02 ENCOUNTER — Other Ambulatory Visit: Payer: BC Managed Care – PPO

## 2023-02-06 ENCOUNTER — Other Ambulatory Visit: Payer: Self-pay | Admitting: Internal Medicine

## 2023-02-09 ENCOUNTER — Ambulatory Visit: Payer: BC Managed Care – PPO | Admitting: Internal Medicine

## 2023-02-09 ENCOUNTER — Encounter: Payer: Self-pay | Admitting: Internal Medicine

## 2023-02-09 VITALS — BP 100/68 | HR 67 | Resp 16 | Ht 65.0 in | Wt 126.8 lb

## 2023-02-09 DIAGNOSIS — E782 Mixed hyperlipidemia: Secondary | ICD-10-CM

## 2023-02-09 DIAGNOSIS — K2100 Gastro-esophageal reflux disease with esophagitis, without bleeding: Secondary | ICD-10-CM

## 2023-02-09 DIAGNOSIS — R1011 Right upper quadrant pain: Secondary | ICD-10-CM | POA: Diagnosis not present

## 2023-02-09 DIAGNOSIS — H903 Sensorineural hearing loss, bilateral: Secondary | ICD-10-CM | POA: Diagnosis not present

## 2023-02-09 DIAGNOSIS — Z0000 Encounter for general adult medical examination without abnormal findings: Secondary | ICD-10-CM | POA: Diagnosis not present

## 2023-02-09 DIAGNOSIS — J3089 Other allergic rhinitis: Secondary | ICD-10-CM

## 2023-02-09 DIAGNOSIS — J301 Allergic rhinitis due to pollen: Secondary | ICD-10-CM

## 2023-02-09 LAB — POCT URINALYSIS DIPSTICK
Bilirubin, UA: NEGATIVE
Blood, UA: NEGATIVE
Glucose, UA: NEGATIVE
Ketones, UA: NEGATIVE
Leukocytes, UA: NEGATIVE
Nitrite, UA: NEGATIVE
Protein, UA: NEGATIVE
Spec Grav, UA: 1.025 (ref 1.010–1.025)
Urobilinogen, UA: 0.2 E.U./dL
pH, UA: 5 (ref 5.0–8.0)

## 2023-02-09 MED ORDER — ESOMEPRAZOLE MAGNESIUM 40 MG PO CPDR: 40.0000 mg | DELAYED_RELEASE_CAPSULE | Freq: Every day | ORAL | 1 refills | Status: DC

## 2023-02-14 ENCOUNTER — Telehealth: Payer: Self-pay | Admitting: Internal Medicine

## 2023-02-14 DIAGNOSIS — K8050 Calculus of bile duct without cholangitis or cholecystitis without obstruction: Secondary | ICD-10-CM

## 2023-02-14 NOTE — Telephone Encounter (Signed)
Patient called to remind [place] she prefers Ejection fraction with hepatobiliary scan. New order sent

## 2023-02-14 NOTE — Telephone Encounter (Signed)
[facility] called to say that the order for the [facility] scan needed to be changed to a Hydro scan with EF

## 2023-02-21 ENCOUNTER — Encounter: Payer: Self-pay | Admitting: Internal Medicine

## 2023-02-21 DIAGNOSIS — R1011 Right upper quadrant pain: Secondary | ICD-10-CM | POA: Insufficient documentation

## 2023-02-21 NOTE — Patient Instructions (Signed)
Continue treatment for allergies with cetirizine 10 mg daily.  Dr. Hulan Saas, neurologist, has patient on Neurontin for chronic back itching.  HIDA scan has been ordered and patient has follow-up in the Fall for colonoscopy with Dr. Norwood Levo but may need to see Dr. Norwood Levo sooner regarding right upper quadrant abdominal pain.  Continue generic Nexium 40 mg daily and watch diet.

## 2023-02-28 ENCOUNTER — Encounter (HOSPITAL_COMMUNITY)
Admission: RE | Admit: 2023-02-28 | Discharge: 2023-02-28 | Disposition: A | Discharge status: Home / Self Care | Payer: BC Managed Care – PPO | Source: Ambulatory Visit | Attending: Internal Medicine | Admitting: Internal Medicine

## 2023-02-28 ENCOUNTER — Encounter (HOSPITAL_COMMUNITY): Admission: RE | Admit: 2023-02-28 | Payer: BC Managed Care – PPO | Source: Ambulatory Visit

## 2023-02-28 DIAGNOSIS — K8050 Calculus of bile duct without cholangitis or cholecystitis without obstruction: Secondary | ICD-10-CM | POA: Insufficient documentation

## 2023-02-28 MED ORDER — TECHNETIUM TC 99M MEBROFENIN IV KIT
5.5000 | PACK | Freq: Once | INTRAVENOUS | Status: AC | PRN
  Administered 2023-02-28: 5.5 via INTRAVENOUS

## 2023-05-08 ENCOUNTER — Ambulatory Visit: Payer: BC Managed Care – PPO | Admitting: Internal Medicine

## 2023-05-08 ENCOUNTER — Encounter: Payer: Self-pay | Admitting: Internal Medicine

## 2023-05-08 VITALS — BP 102/60 | HR 68 | Ht 65.0 in | Wt 128.0 lb

## 2023-05-08 DIAGNOSIS — K219 Gastro-esophageal reflux disease without esophagitis: Secondary | ICD-10-CM | POA: Diagnosis not present

## 2023-05-08 DIAGNOSIS — R1011 Right upper quadrant pain: Secondary | ICD-10-CM

## 2023-05-08 NOTE — Progress Notes (Unsigned)
Chief Complaint: Abdominal pain, nausea  HPI : 52 year old female with history of GERD and osteoporosis presents with abdominal pain and nausea  About 15 years ago she had a flare up of RUQ ab pain and had a HIDA scan that was borderline. She was having throat clearing at that time. She was tried on Protonix and Nexium without any improvement in her symptoms. EGD and colonoscopy 15 years ago were normal. She had a consultation with surgery at that time and she decided against a cholecystectomy. She decided to change her diet and had improvement in her symptoms for a long time. Then the same RUQ ab pain recurred in 12/2022. The pain feels sharp. She had a lot of nausea in 12/2022 and used Zofran to help with her symptoms. Holding pressure in the RUQ seems to help with the pain. She cannot think of anything that set off her pain. She has a consistent diet and will occasionally have a cheat meal with hamburger and fries. Fatty foods do not always set off her symptoms. Overeating seems to make her symptoms worse. The intense RUQ pain went away in 12/2022, but she still has some residual RUQ ab pain. Her RUQ pain usually lasts for a few weeks at a time. Denies chest burning or regurgitation. She does still clear her throat regularly. Denies dysphagia or odynophagia. Denies prior surgeries in her abdomen. She has one BM per day. Denies diarrhea or constipation. Weight has been stable. Both parents had gallbladders removed and some acid reflux runs in the family. She had a colonoscopy in 2022 with removal of colon polyps and she was recommended for a 5 year follow up. Endorses NSAID use sparingly. Denies prior kidney stones. Denies blood in the urine. Denies dysuria. 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 She has two kids that are 24 and 28. Currently in school to finish her Bachelor's in nursing.   Past Medical History:  Diagnosis Date   Allergy    Heart murmur    Past Surgical  History:  Procedure Laterality Date   ABLATION     APPENDECTOMY     ganglion cyst removal     left percutaneous release     meniscus tear     TONSILLECTOMY     Family History  Problem Relation Age of Onset   Hyperlipidemia Father    Hypertension Father    Diabetes Father    Hypertension Brother    Cancer Maternal Grandmother    COPD Maternal Grandfather    Diabetes Maternal Grandfather    Stroke Paternal Grandmother    Alzheimer's disease Paternal Grandmother    Diabetes Paternal Grandfather    Social History   Tobacco Use   Smoking status: Never   Smokeless tobacco: Never  Vaping Use   Vaping status: Never Used  Substance Use Topics   Alcohol use: Yes    Alcohol/week: 0.0 standard drinks of alcohol    Comment: social   Drug use: No   Current Outpatient Medications  Medication Sig Dispense Refill   ascorbic acid (VITAMIN C) 250 MG tablet      Calcium Carb-Cholecalciferol (CALCIUM 500 + D PO) Take by mouth.     estradiol (VIVELLE-DOT) 0.05 MG/24HR patch Place 1 patch onto the skin 2 (two) times a week.     Multiple Vitamin (MULTIVITAMIN ADULT PO) Take by mouth.     progesterone (PROMETRIUM) 100 MG capsule Take 100 mg by mouth daily.  TYMLOS 3120 MCG/1.56ML SOPN Inject into the skin.     cetirizine (ZYRTEC) 10 MG tablet Take 10 mg by mouth daily. (Patient not taking: Reported on 05/08/2023)     esomeprazole (NEXIUM) 40 MG capsule Take 1 capsule (40 mg total) by mouth daily. (Patient not taking: Reported on 05/08/2023) 90 capsule 1   ondansetron (ZOFRAN) 4 MG tablet Take 1 tablet (4 mg total) by mouth every 8 (eight) hours as needed for nausea or vomiting. (Patient not taking: Reported on 05/08/2023) 30 tablet 1   No current facility-administered medications for this visit.   Allergies  Allergen Reactions   Sulfa Antibiotics Swelling   Penicillins Rash   Tramadol Other (See Comments) and Nausea And Vomiting    Other reaction(s): Vomiting   Latex Rash and Other  (See Comments)   Review of Systems: All systems reviewed and negative except where noted in HPI.   Physical Exam: BP 102/60   Pulse 68   Ht 5\' 5"  (1.651 m)   Wt 128 lb (58.1 kg)   LMP 11/12/2020   BMI 21.30 kg/m  Constitutional: Pleasant,well-developed, female in no acute distress. HEENT: Normocephalic and atraumatic. Conjunctivae are normal. No scleral icterus. Cardiovascular: Normal rate, regular rhythm.  Pulmonary/chest: Effort normal and breath sounds normal. No wheezing, rales or rhonchi. Abdominal: Soft, nondistended, tender in the RUQ (initially over the right lower ribs but then more underneath her rib). Bowel sounds active throughout.  Extremities: No edema Neurological: Alert and oriented to person place and time. Skin: Skin is warm and dry. No rashes noted. Psychiatric: Normal mood and affect. Behavior is normal.  Labs 12/2022: CBC nml. CMP nml. TSH nml. Amylase nml. Vit D nml.  RUQ U/S 01/18/23: IMPRESSION: Unremarkable right upper quadrant ultrasound  HIDA scan 02/28/23: IMPRESSION: 1.  Patent cystic and common bile ducts. 2.  Low normal gallbladder ejection fraction  ASSESSMENT AND PLAN: RUQ ab pain GERD Low/normal gallbladder ejection fraction (EF 36% on last HIDA scan) Patient presents with right upper quadrant abdominal pain that was particularly intense in 12/2022 and has improved since then.  She had this pain 15 years ago, which ended up resolving on its own.  She has had 2 HIDA scans in the past that have shown a low/normal gallbladder ejection fraction.  Her gallbladder could be a source of her abdominal pain, though will rule out alternative etiologies first. Her prior lab workup has shown normal LFTs and normal amylase in the past.  Will plan for some further lab workup as well as a CT A/P.  Will also plan to proceed with an EGD for further evaluation.  - Check TTG IgA, IgA, CRP. Will send to [facility] lab - CT A/P w/contrast. Will give handwritten prescription  to be able to get [facility] - EGD [facility] after 07/05/23 - Consider referral for consideration of cholecystectomy in the future  Susan Pont, MD  I spent 62 minutes of time, including in depth chart review, independent review of results as outlined above, communicating results with the patient directly, face-to-face time with the patient, coordinating care, ordering studies and medications as appropriate, and documentation.

## 2023-05-08 NOTE — Patient Instructions (Addendum)
You have been scheduled for an endoscopy. Please follow written instructions given to you at your visit today.  If you use inhalers (even only as needed), please bring them with you on the day of your procedure.  If you take any of the following medications, they will need to be adjusted prior to your procedure:   DO NOT TAKE 7 DAYS PRIOR TO TEST- Trulicity (dulaglutide) Ozempic, Wegovy (semaglutide) Mounjaro (tirzepatide) Bydureon Bcise (exanatide extended release)  DO NOT TAKE 1 DAY PRIOR TO YOUR TEST Rybelsus (semaglutide) Adlyxin (lixisenatide) Victoza (liraglutide) Byetta (exanatide) ___________________________________________________________________________    We are giving you orders for lab and CT abdomen and pelvis with contrast please make sure to have results fax to Dr Leonides Schanz fax#[phone] If your blood pressure at your visit was 140/90 or greater, please contact your primary care physician to follow up on this.  _______________________________________________________  If you are age 53 or older, your body mass index should be between 23-30. Your Body mass index is 21.3 kg/m. If this is out of the aforementioned range listed, please consider follow up with your Primary Care Provider.  If you are age 61 or younger, your body mass index should be between 19-25. Your Body mass index is 21.3 kg/m. If this is out of the aformentioned range listed, please consider follow up with your Primary Care Provider.   ________________________________________________________  The [facility] providers would like to encourage you to use [facility] to communicate with providers for non-urgent requests or questions.  Due to long hold times on the telephone, sending your provider a message by [facility] may be a faster and more efficient way to get a response.  Please allow 48 business hours for a response.  Please remember that this is for non-urgent requests.   _______________________________________________________  Due to recent changes in healthcare laws, you may see the results of your imaging and laboratory studies on MyChart before your provider has had a chance to review them.  We understand that in some cases there may be results that are confusing or concerning to you. Not all laboratory results come back in the same time frame and the provider may be waiting for multiple results in order to interpret others.  Please give [place] 48 hours in order for your provider to thoroughly review all the results before contacting the office for clarification of your results.   Thank you for entrusting me with your care and for choosing [facility], Dr. Eulah Pont

## 2023-05-10 LAB — IGA: Immunoglobulin A: 194 mg/dL (ref 47–310)

## 2023-05-10 LAB — HIGH SENSITIVITY CRP: hs-CRP: 2.2 mg/L

## 2023-05-10 LAB — TISSUE TRANSGLUTAMINASE, IGA: (tTG) Ab, IgA: <1 U/mL

## 2023-06-29 ENCOUNTER — Other Ambulatory Visit: Payer: Self-pay

## 2023-06-29 ENCOUNTER — Telehealth: Payer: Self-pay | Admitting: Internal Medicine

## 2023-06-29 NOTE — Telephone Encounter (Signed)
Patient called and stated that she needed to reschedule her procedure. I rescheduled patient for Dec 27th at 3:00 pm. Patient also stated that they would like a call back to go over the times when she should stop eating and drinking due to her changing the procedure for the afternoon. Please advise.

## 2023-06-29 NOTE — Telephone Encounter (Signed)
Reviewed instructions for EGD with the patient. Clear liquids only after 12 am 07/20/23. NPO after 12 pm. Arrive to 4th floor at 2:00 pm. No further questions or concerns.

## 2023-07-05 ENCOUNTER — Encounter: Payer: Self-pay | Admitting: Internal Medicine

## 2023-07-13 ENCOUNTER — Encounter: Payer: BC Managed Care – PPO | Admitting: Internal Medicine

## 2023-07-20 ENCOUNTER — Encounter: Payer: Self-pay | Admitting: Internal Medicine

## 2023-07-20 ENCOUNTER — Ambulatory Visit: Payer: BC Managed Care – PPO | Admitting: Internal Medicine

## 2023-07-20 VITALS — BP 110/65 | HR 67 | Temp 97.5°F | Resp 9 | Ht 65.0 in | Wt 128.0 lb

## 2023-07-20 DIAGNOSIS — K219 Gastro-esophageal reflux disease without esophagitis: Secondary | ICD-10-CM | POA: Diagnosis not present

## 2023-07-20 DIAGNOSIS — K3189 Other diseases of stomach and duodenum: Secondary | ICD-10-CM | POA: Diagnosis not present

## 2023-07-20 DIAGNOSIS — R1011 Right upper quadrant pain: Secondary | ICD-10-CM

## 2023-07-20 DIAGNOSIS — K2289 Other specified disease of esophagus: Secondary | ICD-10-CM

## 2023-07-20 DIAGNOSIS — K319 Disease of stomach and duodenum, unspecified: Secondary | ICD-10-CM | POA: Diagnosis not present

## 2023-07-20 MED ORDER — SODIUM CHLORIDE 0.9 % IV SOLN: 500.0000 mL | Freq: Once | INTRAVENOUS | Status: DC

## 2023-07-20 MED ORDER — PANTOPRAZOLE SODIUM 40 MG PO TBEC: 40.0000 mg | DELAYED_RELEASE_TABLET | Freq: Two times a day (BID) | ORAL | 1 refills | Status: DC

## 2023-07-20 NOTE — Op Note (Signed)
[facility] Patient Name: Susan Massey Procedure Date: 07/20/2023 2:26 PM MRN: [id number] Endoscopist: Madelyn Brunner [place] , , [id number] Age: 52 Referring MD:  Date of Birth: 1971/02/10 Gender: Female Account #: [id number] Procedure:                Upper GI endoscopy Indications:              Abdominal pain in the right upper quadrant,                            Heartburn Medicines:                Monitored Anesthesia Care Procedure:                Pre-Anesthesia Assessment:                           - Prior to the procedure, a History and Physical                            was performed, and patient medications and                            allergies were reviewed. The patient's tolerance of                            previous anesthesia was also reviewed. The risks                            and benefits of the procedure and the sedation                            options and risks were discussed with the patient.                            All questions were answered, and informed consent                            was obtained. Prior Anticoagulants: The patient has                            taken no anticoagulant or antiplatelet agents. ASA                            Grade Assessment: II - A patient with mild systemic                            disease. After reviewing the risks and benefits,                            the patient was deemed in satisfactory condition to                            undergo the procedure.  After obtaining informed consent, the endoscope was                            passed under direct vision. Throughout the                            procedure, the patient's blood pressure, pulse, and                            oxygen saturations were monitored continuously. The                            GIF [id number] #[id number] was introduced through the                            mouth, and advanced to the second part of  duodenum.                            The upper GI endoscopy was accomplished without                            difficulty. The patient tolerated the procedure                            well. Scope In: Scope Out: Findings:                 White nummular lesions were noted in the lower                            third of the esophagus. Biopsies were taken with a                            cold forceps for histology.                           Localized mildly erythematous mucosa without                            bleeding was found in the gastric antrum. Biopsies                            were taken with a cold forceps for histology.                           The examined duodenum was normal. Complications:            No immediate complications. Estimated Blood Loss:     Estimated blood loss was minimal. Impression:               - White nummular lesions in esophageal mucosa.                            Biopsied.                           -  Erythematous mucosa in the antrum. Biopsied.                           - Normal examined duodenum. Recommendation:           - Discharge patient to home (with escort).                           - Await pathology results.                           - Trial of pantoprazole 40 mg BID for 8 weeks. Take                            30 min to 1 hr before eating breakfast and dinner.                            If this does not help with your symptoms, then let                            me know, and we will put in the referral to surgery                            for consideration of cholecystectomy.                           - Follow up in GI clinic in 3 months.                           - The findings and recommendations were discussed                            with the patient. Dr Particia Lather "Alan Ripper" Leonides Schanz,  07/20/2023 2:45:57 PM

## 2023-07-20 NOTE — Progress Notes (Signed)
Report to PACU, RN, vss, BBS= Clear.  

## 2023-07-20 NOTE — Progress Notes (Signed)
GASTROENTEROLOGY PROCEDURE H&P NOTE   Primary Care Physician: Margaree Mackintosh, MD    Reason for Procedure:   RUQ ab pain, GERD  Plan:    EGD  Patient is appropriate for endoscopic procedure(s) in the ambulatory ([facility]) setting.  The nature of the procedure, as well as the risks, benefits, and alternatives were carefully and thoroughly reviewed with the patient. Ample time for discussion and questions allowed. The patient understood, was satisfied, and agreed to proceed.     HPI: Susan Massey is a 52 y.o. female who presents for EGD for evaluation of RUQ ab pain, GERD .  Patient was most recently seen in the Gastroenterology Clinic on 05/08/23.  No interval change in medical history since that appointment. Please refer to that note for full details regarding GI history and clinical presentation.   Past Medical History:  Diagnosis Date   Allergy    Heart murmur    Osteoporosis     Past Surgical History:  Procedure Laterality Date   ABLATION     APPENDECTOMY     COLONOSCOPY W/ POLYPECTOMY     ganglion cyst removal     left percutaneous release     meniscus tear     TONSILLECTOMY      Prior to Admission medications   Medication Sig Start Date End Date Taking? Authorizing Provider  ascorbic acid (VITAMIN C) 250 MG tablet    Yes [provider]  B-D UF III MINI PEN NEEDLES 31G X 5 MM MISC Inject into the skin daily.   Yes [provider]  Calcium Carb-Cholecalciferol (OYSTER SHELL CALCIUM W/D) 500-5 MG-MCG TABS Take by mouth.   Yes [provider]  estradiol (VIVELLE-DOT) 0.05 MG/24HR patch Place 1 patch onto the skin 2 (two) times a week. 07/18/21  Yes [provider]  Multiple Vitamin (MULTIVITAMIN ADULT PO) Take by mouth.   Yes [provider]  progesterone (PROMETRIUM) 100 MG capsule Take 100 mg by mouth daily. 07/09/21  Yes [provider]  TYMLOS 3120 MCG/1.56ML SOPN Inject into the skin.   Yes [provider]  cetirizine (ZYRTEC) 10 MG tablet Take 10 mg by mouth daily. Patient not taking: Reported on 07/20/2023    [provider]  esomeprazole (NEXIUM) 40 MG capsule Take 1 capsule (40 mg total) by mouth daily. Patient not taking: Reported on 07/20/2023 02/09/23   Margaree Mackintosh, MD  ondansetron (ZOFRAN) 4 MG tablet Take 1 tablet (4 mg total) by mouth every 8 (eight) hours as needed for nausea or vomiting. Patient not taking: Reported on 07/20/2023 12/29/22   Margaree Mackintosh, MD    Current Outpatient Medications  Medication Sig Dispense Refill   ascorbic acid (VITAMIN C) 250 MG tablet      B-D UF III MINI PEN NEEDLES 31G X 5 MM MISC Inject into the skin daily.     Calcium Carb-Cholecalciferol (OYSTER SHELL CALCIUM W/D) 500-5 MG-MCG TABS Take by mouth.     estradiol (VIVELLE-DOT) 0.05 MG/24HR patch Place 1 patch onto the skin 2 (two) times a week.     Multiple Vitamin (MULTIVITAMIN ADULT PO) Take by mouth.     progesterone (PROMETRIUM) 100 MG capsule Take 100 mg by mouth daily.     TYMLOS 3120 MCG/1.56ML SOPN Inject into the skin.     cetirizine (ZYRTEC) 10 MG tablet Take 10 mg by mouth daily. (Patient not taking: Reported on 07/20/2023)     esomeprazole (NEXIUM) 40 MG capsule Take 1 capsule (  40 mg total) by mouth daily. (Patient not taking: Reported on 07/20/2023) 90 capsule 1   ondansetron (ZOFRAN) 4 MG tablet Take 1 tablet (4 mg total) by mouth every 8 (eight) hours as needed for nausea or vomiting. (Patient not taking: Reported on 07/20/2023) 30 tablet 1   Current Facility-Administered Medications  Medication Dose Route Frequency Provider Last Rate Last Admin   0.9 %  sodium chloride infusion  500 mL Intravenous Once Imogene Burn, MD        Allergies as of 07/20/2023 - Review Complete 07/20/2023  Allergen Reaction Noted   Sulfa antibiotics Swelling 02/11/2014   Penicillins Rash 06/29/2014   Tramadol Other (See Comments) and Nausea And Vomiting 05/12/2019   Latex  Rash and Other (See Comments) 05/12/2019    Family History  Problem Relation Age of Onset   Colon polyps Mother    Colon polyps Father    Hyperlipidemia Father    Hypertension Father    Diabetes Father    Hypertension Brother    Cancer Maternal Grandmother    COPD Maternal Grandfather    Diabetes Maternal Grandfather    Stroke Paternal Grandmother    Alzheimer's disease Paternal Grandmother    Diabetes Paternal Grandfather    Colon cancer Neg Hx    Esophageal cancer Neg Hx    Rectal cancer Neg Hx    Stomach cancer Neg Hx     Social History   Socioeconomic History   Marital status: Married    Spouse name: Not on file   Number of children: 2   Years of education: Not on file   Highest education level: Not on file  Occupational History   Occupation: health transplant  Tobacco Use   Smoking status: Never   Smokeless tobacco: Never  Vaping Use   Vaping status: Never Used  Substance and Sexual Activity   Alcohol use: Yes    Alcohol/week: 0.0 standard drinks of alcohol    Comment: social   Drug use: No   Sexual activity: Not on file  Other Topics Concern   Not on file  Social History Narrative   Not on file   Social Drivers of Health   Financial Resource Strain: Low Risk  (07/27/2017)   Overall Financial Resource Strain (CARDIA)    Difficulty of Paying Living Expenses: Not hard at all  Food Insecurity: No Food Insecurity (07/27/2017)   Hunger Vital Sign    Worried About Running Out of Food in the Last Year: Never true    Ran Out of Food in the Last Year: Never true  Transportation Needs: No Transportation Needs (07/27/2017)   PRAPARE - Administrator, Civil Service (Medical): No    Lack of Transportation (Non-Medical): No  Physical Activity: Insufficiently Active (07/27/2017)   Exercise Vital Sign    Days of Exercise per Week: 2 days    Minutes of Exercise per Session: 30 min  Stress: No Stress Concern Present (07/27/2017)   [employer] of  Occupational Health - Occupational Stress Questionnaire    Feeling of Stress : Not at all  Social Connections: Socially Integrated (07/27/2017)   Social Connection and Isolation Panel [NHANES]    Frequency of Communication with Friends and Family: More than three times a week    Frequency of Social Gatherings with Friends and Family: More than three times a week    Attends Religious Services: More than 4 times per year    Active Member of [employer] or Organizations: Yes  Attends Banker Meetings: More than 4 times per year    Marital Status: Married  Catering manager Violence: Not At Risk (07/27/2017)   Humiliation, Afraid, Rape, and Kick questionnaire    Fear of Current or Ex-Partner: No    Emotionally Abused: No    Physically Abused: No    Sexually Abused: No    Physical Exam: Vital signs in last 24 hours: BP 113/76   Pulse 92   Temp (!) 97.5 F (36.4 C) (Temporal)   Ht 5\' 5"  (1.651 m)   Wt 128 lb (58.1 kg)   LMP 11/12/2020   SpO2 99%   BMI 21.30 kg/m  GEN: NAD EYE: Sclerae anicteric ENT: MMM CV: Non-tachycardic Pulm: No increased WOB GI: Soft NEURO:  Alert & Oriented   Eulah Pont, MD [facility]   07/20/2023 2:24 PM

## 2023-07-20 NOTE — Patient Instructions (Signed)
-   Discharge patient to home (with escort). - Await pathology results. - Trial of pantoprazole 40 mg BID for 8 weeks. Take 30 min to 1 hr before eating breakfast and dinner. If this does not help with your symptoms, then let me know, and we will put in the referral to surgery for consideration of cholecystectomy. - Follow up in GI clinic in 3 months.   YOU HAD AN ENDOSCOPIC PROCEDURE TODAY AT THE [facility]:   Refer to the procedure report that was given to you for any specific questions about what was found during the examination.  If the procedure report does not answer your questions, please call your gastroenterologist to clarify.  If you requested that your care partner not be given the details of your procedure findings, then the procedure report has been included in a sealed envelope for you to review at your convenience later.  YOU SHOULD EXPECT: Some feelings of bloating in the abdomen. Passage of more gas than usual.  Walking can help get rid of the air that was put into your GI tract during the procedure and reduce the bloating. If you had a lower endoscopy (such as a colonoscopy or flexible sigmoidoscopy) you may notice spotting of blood in your stool or on the toilet paper. If you underwent a bowel prep for your procedure, you may not have a normal bowel movement for a few days.  Please Note:  You might notice some irritation and congestion in your nose or some drainage.  This is from the oxygen used during your procedure.  There is no need for concern and it should clear up in a day or so.  SYMPTOMS TO REPORT IMMEDIATELY: Following upper endoscopy (EGD)  Vomiting of blood or coffee ground material  New chest pain or pain under the shoulder blades  Painful or persistently difficult swallowing  New shortness of breath  Fever of 100F or higher  Black, tarry-looking stools  For urgent or emergent issues, a gastroenterologist can be reached at any hour by calling [phone]  [phone]. Do not use MyChart messaging for urgent concerns.    DIET:  We do recommend a small meal at first, but then you may proceed to your regular diet.  Drink plenty of fluids but you should avoid alcoholic beverages for 24 hours.  ACTIVITY:  You should plan to take it easy for the rest of today and you should NOT DRIVE or use heavy machinery until tomorrow (because of the sedation medicines used during the test).    FOLLOW UP: Our staff will call the number listed on your records the next business day following your procedure.  We will call around 7:15- 8:00 am to check on you and address any questions or concerns that you may have regarding the information given to you following your procedure. If we do not reach you, we will leave a message.     If any biopsies were taken you will be contacted by phone or by letter within the next 1-3 weeks.  Please call us at [phone] if you have not heard about the biopsies in 3 weeks.    SIGNATURES/CONFIDENTIALITY: You and/or your care partner have signed paperwork which will be entered into your electronic medical record.  These signatures attest to the fact that that the information above on your After Visit Summary has been reviewed and is understood.  Full responsibility of the confidentiality of this discharge information lies with you and/or your care-partner.

## 2023-07-23 ENCOUNTER — Telehealth: Payer: Self-pay

## 2023-07-23 NOTE — Telephone Encounter (Signed)
  Follow up Call-     07/20/2023    2:15 PM  Call back number  Post procedure Call Back phone  # [phone]  Permission to leave phone message Yes     Patient questions:  Do you have a fever, pain , or abdominal swelling? No. Pain Score  0 *  Have you tolerated food without any problems? Yes.    Have you been able to return to your normal activities? Yes.    Do you have any questions about your discharge instructions: Diet   No. Medications  No. Follow up visit  No.  Do you have questions or concerns about your Care? No.  Actions: * If pain score is 4 or above: No action needed, pain <4.

## 2023-07-27 ENCOUNTER — Encounter: Payer: Self-pay | Admitting: Internal Medicine

## 2023-07-27 LAB — SURGICAL PATHOLOGY

## 2023-07-30 ENCOUNTER — Encounter: Payer: Self-pay | Admitting: Internal Medicine

## 2023-08-15 ENCOUNTER — Telehealth: Payer: Self-pay | Admitting: Internal Medicine

## 2023-08-15 NOTE — Telephone Encounter (Signed)
Called patient to schedule follow up with Dr. Leonides Schanz.

## 2023-10-24 ENCOUNTER — Ambulatory Visit: Payer: BC Managed Care – PPO | Admitting: Internal Medicine

## 2024-01-10 ENCOUNTER — Ambulatory Visit: Admitting: Internal Medicine

## 2024-01-10 ENCOUNTER — Encounter: Payer: Self-pay | Admitting: Internal Medicine

## 2024-01-10 VITALS — BP 112/76 | HR 79 | Ht 65.0 in | Wt 133.0 lb

## 2024-01-10 DIAGNOSIS — K219 Gastro-esophageal reflux disease without esophagitis: Secondary | ICD-10-CM

## 2024-01-10 DIAGNOSIS — R1011 Right upper quadrant pain: Secondary | ICD-10-CM | POA: Diagnosis not present

## 2024-01-10 DIAGNOSIS — K828 Other specified diseases of gallbladder: Secondary | ICD-10-CM | POA: Diagnosis not present

## 2024-01-10 MED ORDER — PANTOPRAZOLE SODIUM 40 MG PO TBEC: 40.0000 mg | DELAYED_RELEASE_TABLET | Freq: Every day | ORAL | 1 refills | Status: DC

## 2024-01-10 NOTE — Progress Notes (Signed)
 Chief Complaint: Abdominal pain, nausea  HPI : 53 year old female with history of GERD and osteoporosis presents with abdominal pain and nausea  About 15 years ago she had a flare up of RUQ ab pain and had a HIDA scan that was borderline. She was having throat clearing at that time. She was tried on Protonix  and Nexium  without any improvement in her symptoms. EGD and colonoscopy 15 years ago were normal. She had a consultation with surgery at that time and she decided against a cholecystectomy. She decided to change her diet and had improvement in her symptoms for a long time. Then the same RUQ ab pain recurred in 12/2022. The pain feels sharp. She had a lot of nausea in 12/2022 and used Zofran  to help with her symptoms. Holding pressure in the RUQ seems to help with the pain. She cannot think of anything that set off her pain. She has a consistent diet and will occasionally have a cheat meal with hamburger and fries. Fatty foods do not always set off her symptoms. Overeating seems to make her symptoms worse. The intense RUQ pain went away in 12/2022, but she still has some residual RUQ ab pain. Her RUQ pain usually lasts for a few weeks at a time. Both parents had gallbladders removed and some acid reflux runs in the family. She had a colonoscopy in 2022 with removal of colon polyps and she was recommended for a 5 year follow up. 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   Interval History: If she overeats or has fatty foods, then she will sometimes have RUQ ab pain and have to put pressure on that area. Pain is not that bothersome at all currently. She did try the pantoprazole  40 mg every day and it did seem to help with throat clearing. She did take the PPI consistently when she went on a cruise. She is not sure if the PPI helped at all with her RUQ ab pain. She has not had to use Zofran  in a long time. Bowel habits are still normal. 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   Past Medical History:  Diagnosis Date   Allergy    Heart murmur    Osteoporosis    Past Surgical History:  Procedure Laterality Date   ABLATION     APPENDECTOMY     COLONOSCOPY W/ POLYPECTOMY     ganglion cyst removal     left percutaneous release     meniscus tear     TONSILLECTOMY     Family History  Problem Relation Age of Onset   Colon polyps Mother    Colon polyps Father    Hyperlipidemia Father    Hypertension Father    Diabetes Father    Hypertension Brother    Cancer Maternal Grandmother    COPD Maternal Grandfather    Diabetes Maternal Grandfather    Stroke Paternal Grandmother    Alzheimer's disease Paternal Grandmother    Diabetes Paternal Grandfather    Colon cancer Neg Hx    Esophageal cancer Neg Hx    Rectal cancer Neg Hx    Stomach cancer Neg Hx    Social History   Tobacco Use   Smoking status: Never   Smokeless tobacco: Never  Vaping Use   Vaping status: Never Used  Substance Use Topics   Alcohol use: Yes    Alcohol/week: 0.0 standard drinks of alcohol    Comment: social  Drug use: No   Current Outpatient Medications  Medication Sig Dispense Refill   ascorbic acid (VITAMIN C) 250 MG tablet      B-D UF III MINI PEN NEEDLES 31G X 5 MM MISC Inject into the skin daily.     Calcium Carb-Cholecalciferol (OYSTER SHELL CALCIUM W/D) 500-5 MG-MCG TABS Take by mouth.     cetirizine (ZYRTEC) 10 MG tablet Take 10 mg by mouth as needed for allergies.     estradiol (VIVELLE-DOT) 0.05 MG/24HR patch Place 1 patch onto the skin 2 (two) times a week.     Multiple Vitamin (MULTIVITAMIN ADULT PO) Take by mouth.     pantoprazole  (PROTONIX ) 40 MG tablet Take 1 tablet (40 mg total) by mouth 2 (two) times daily. 180 tablet 1   progesterone (PROMETRIUM) 100 MG capsule Take 100 mg by mouth daily.     TYMLOS 3120 MCG/1.56ML SOPN Inject into the skin.     No current facility-administered medications for this visit.   Allergies  Allergen Reactions   Sulfa  Antibiotics Swelling   Penicillins Rash   Tramadol Other (See Comments) and Nausea And Vomiting    Other reaction(s): Vomiting   Latex Rash and Other (See Comments)    Physical Exam: BP 112/76   Pulse 79   Ht 5' 5 (1.651 m)   Wt 133 lb (60.3 kg)   LMP 11/12/2020   SpO2 96%   BMI 22.13 kg/m  Constitutional: Pleasant,well-developed, female in no acute distress. HEENT: Normocephalic and atraumatic. Conjunctivae are normal. No scleral icterus. Cardiovascular: Normal rate, regular rhythm.  Pulmonary/chest: Effort normal and breath sounds normal. No wheezing, rales or rhonchi. Abdominal: Soft, nondistended, tender in the RUQ (initially over the right lower ribs but then more underneath her rib). Bowel sounds active throughout.  Extremities: No edema Neurological: Alert and oriented to person place and time. Skin: Skin is warm and dry. No rashes noted. Psychiatric: Normal mood and affect. Behavior is normal.  Labs 12/2022: CBC nml. CMP nml. TSH nml. Amylase nml. Vit D nml.  Labs 04/2023: TTG IgA negative. IgA nml. CRP nml.   RUQ U/S 01/18/23: IMPRESSION: Unremarkable right upper quadrant ultrasound  HIDA scan 02/28/23: IMPRESSION: 1.  Patent cystic and common bile ducts. 2.  Low normal gallbladder ejection fraction  EGD 07/20/23: - White nummular lesions in esophageal mucosa. Biopsied. - Erythematous mucosa in the antrum. Biopsied. - Normal examined duodenum. Path: 1. Surgical [P], gastric :       - GASTRIC ANTRAL MUCOSA WITH MILD NONSPECIFIC REACTIVE GASTROPATHY       - GASTRIC OXYNTIC MUCOSA WITH NO SPECIFIC HISTOPATHOLOGIC CHANGES       - HELICOBACTER PYLORI-LIKE ORGANISMS ARE NOT IDENTIFIED ON ROUTINE H&E STAIN       2. Surgical [P], esophageal :       - ESOPHAGEAL SQUAMOUS MUCOSA WITH NO SPECIFIC HISTOPATHOLOGIC CHANGES       - NEGATIVE FOR INCREASED INTRAEPITHELIAL EOSINOPHI   ASSESSMENT AND PLAN: RUQ ab pain GERD Low/normal gallbladder ejection fraction (EF 36% on  last HIDA scan) Patient overall has been doing well. Her RUQ ab pain has not been as bothersome. She has not had to use Zofran  at all recently. A trial of PPI did not make a significant difference in her pain but did help with some throat clearing issues. Will continue her PPI every day for now. She can try using Pepcid  PRN to see if this could be a replacement for her PPI therapy. Patient will let me  know if she would like a surgery referral to consideration of cholecystectomy. - Continue to avoid fatty foods - Continue pantoprazole  40 mg daily - Consider referral for consideration of cholecystectomy in the future - Next colonoscopy due in 2027 for polyp surveillance. Will put in recall - RTC PRN  Regino Caprio, MD  I spent 25 minutes of time, including in depth chart review, independent review of results as outlined above, communicating results with the patient directly, face-to-face time with the patient, coordinating care, ordering studies and medications as appropriate, and documentation.

## 2024-01-10 NOTE — Patient Instructions (Signed)
 Continue to avoid fatty foods   If your blood pressure at your visit was 140/90 or greater, please contact your primary care physician to follow up on this.  _______________________________________________________  If you are age 54 or older, your body mass index should be between 23-30. Your Body mass index is 22.13 kg/m. If this is out of the aforementioned range listed, please consider follow up with your Primary Care Provider.  If you are age 64 or younger, your body mass index should be between 19-25. Your Body mass index is 22.13 kg/m. If this is out of the aformentioned range listed, please consider follow up with your Primary Care Provider.   ________________________________________________________  The [facility] providers would like to encourage you to use MYCHART to communicate with providers for non-urgent requests or questions.  Due to long hold times on the telephone, sending your provider a message by [facility] may be a faster and more efficient way to get a response.  Please allow 48 business hours for a response.  Please remember that this is for non-urgent requests.  _______________________________________________________  Thank you for entrusting me with your care and for choosing [facility], Dr. Regino Caprio

## 2024-01-17 ENCOUNTER — Encounter: Payer: Self-pay | Admitting: Internal Medicine

## 2024-01-25 ENCOUNTER — Other Ambulatory Visit: Payer: Self-pay | Admitting: Internal Medicine

## 2024-02-08 ENCOUNTER — Other Ambulatory Visit: Payer: BC Managed Care – PPO

## 2024-02-08 DIAGNOSIS — E782 Mixed hyperlipidemia: Secondary | ICD-10-CM

## 2024-02-08 DIAGNOSIS — Z0000 Encounter for general adult medical examination without abnormal findings: Secondary | ICD-10-CM

## 2024-02-08 DIAGNOSIS — M818 Other osteoporosis without current pathological fracture: Secondary | ICD-10-CM

## 2024-02-08 DIAGNOSIS — K2100 Gastro-esophageal reflux disease with esophagitis, without bleeding: Secondary | ICD-10-CM

## 2024-02-09 LAB — CBC WITH DIFFERENTIAL/PLATELET
Absolute Lymphocytes: 1405 {cells}/uL (ref 850–3900)
Absolute Monocytes: 371 {cells}/uL (ref 200–950)
Basophils Absolute: 42 {cells}/uL (ref 0–200)
Basophils Relative: 0.9 %
Eosinophils Absolute: 99 {cells}/uL (ref 15–500)
Eosinophils Relative: 2.1 %
HCT: 40.1 % (ref 35.0–45.0)
Hemoglobin: 13 g/dL (ref 11.7–15.5)
MCH: 30 pg (ref 27.0–33.0)
MCHC: 32.4 g/dL (ref 32.0–36.0)
MCV: 92.6 fL (ref 80.0–100.0)
MPV: 11.1 fL (ref 7.5–12.5)
Monocytes Relative: 7.9 %
Neutro Abs: 2782 {cells}/uL (ref 1500–7800)
Neutrophils Relative %: 59.2 %
Platelets: 258 Thousand/uL (ref 140–400)
RBC: 4.33 Million/uL (ref 3.80–5.10)
RDW: 11.7 % (ref 11.0–15.0)
Total Lymphocyte: 29.9 %
WBC: 4.7 Thousand/uL (ref 3.8–10.8)

## 2024-02-09 LAB — TSH: TSH: 0.89 m[IU]/L

## 2024-02-09 LAB — COMPLETE METABOLIC PANEL WITHOUT GFR
AG Ratio: 2 (calc) (ref 1.0–2.5)
ALT: 13 U/L (ref 6–29)
AST: 19 U/L (ref 10–35)
Albumin: 4.6 g/dL (ref 3.6–5.1)
Alkaline phosphatase (APISO): 75 U/L (ref 37–153)
BUN: 23 mg/dL (ref 7–25)
CO2: 28 mmol/L (ref 20–32)
Calcium: 10.4 mg/dL (ref 8.6–10.4)
Chloride: 104 mmol/L (ref 98–110)
Creat: 0.67 mg/dL (ref 0.50–1.03)
Globulin: 2.3 g/dL (ref 1.9–3.7)
Glucose, Bld: 95 mg/dL (ref 65–99)
Potassium: 5.7 mmol/L — ABNORMAL HIGH (ref 3.5–5.3)
Sodium: 142 mmol/L (ref 135–146)
Total Bilirubin: 0.5 mg/dL (ref 0.2–1.2)
Total Protein: 6.9 g/dL (ref 6.1–8.1)

## 2024-02-09 LAB — LIPID PANEL
Cholesterol: 191 mg/dL (ref <200)
HDL: 81 mg/dL (ref >=50)
LDL Cholesterol (Calc): 93 mg/dL
Non-HDL Cholesterol (Calc): 110 mg/dL (ref <130)
Total CHOL/HDL Ratio: 2.4 (calc) (ref <5.0)
Triglycerides: 78 mg/dL (ref <150)

## 2024-02-10 ENCOUNTER — Ambulatory Visit: Payer: Self-pay | Admitting: Internal Medicine

## 2024-02-15 ENCOUNTER — Encounter: Payer: BC Managed Care – PPO | Admitting: Internal Medicine

## 2024-02-22 ENCOUNTER — Encounter: Payer: Self-pay | Admitting: Internal Medicine

## 2024-02-22 ENCOUNTER — Ambulatory Visit: Payer: BC Managed Care – PPO | Admitting: Internal Medicine

## 2024-02-22 VITALS — BP 120/68 | HR 94 | Ht 65.0 in | Wt 133.0 lb

## 2024-02-22 DIAGNOSIS — H903 Sensorineural hearing loss, bilateral: Secondary | ICD-10-CM | POA: Diagnosis not present

## 2024-02-22 DIAGNOSIS — E782 Mixed hyperlipidemia: Secondary | ICD-10-CM | POA: Diagnosis not present

## 2024-02-22 DIAGNOSIS — Z0000 Encounter for general adult medical examination without abnormal findings: Secondary | ICD-10-CM

## 2024-02-22 DIAGNOSIS — J3089 Other allergic rhinitis: Secondary | ICD-10-CM

## 2024-02-22 DIAGNOSIS — R1011 Right upper quadrant pain: Secondary | ICD-10-CM

## 2024-02-22 DIAGNOSIS — K2100 Gastro-esophageal reflux disease with esophagitis, without bleeding: Secondary | ICD-10-CM | POA: Diagnosis not present

## 2024-02-22 DIAGNOSIS — J301 Allergic rhinitis due to pollen: Secondary | ICD-10-CM

## 2024-02-22 DIAGNOSIS — R079 Chest pain, unspecified: Secondary | ICD-10-CM | POA: Diagnosis not present

## 2024-02-22 DIAGNOSIS — Z8639 Personal history of other endocrine, nutritional and metabolic disease: Secondary | ICD-10-CM

## 2024-02-22 DIAGNOSIS — M810 Age-related osteoporosis without current pathological fracture: Secondary | ICD-10-CM

## 2024-02-22 LAB — POCT URINALYSIS DIP (CLINITEK)
Bilirubin, UA: NEGATIVE
Blood, UA: NEGATIVE
Glucose, UA: NEGATIVE mg/dL
Ketones, POC UA: NEGATIVE mg/dL
Leukocytes, UA: NEGATIVE
Nitrite, UA: NEGATIVE
POC PROTEIN,UA: NEGATIVE
Spec Grav, UA: 1.01 (ref 1.010–1.025)
Urobilinogen, UA: 0.2 U/dL
pH, UA: 6.5 (ref 5.0–8.0)

## 2024-02-22 NOTE — Progress Notes (Signed)
 Annual Wellness Visit   Patient Care Team: Perri Ronal PARAS, MD as PCP - General (Internal Medicine)  Visit Date: 02/25/24   Chief Complaint  Patient presents with   Annual Exam   Chest Pain   Subjective:  Patient: Susan Massey, Female DOB: [birth year], 53 y.o. MRN: [id number]  Susan Massey is a 53 y.o. Female who presents today for her Annual Wellness Visit. Patient has Neck Pain; Neck Stiffness; Muscle Spasms, Neck; Arthralgia, Both Elbows; Seasonal & Perennial Allergic Rhinitis; Osteoporosis; Bilateral Hearing Loss; Dyspepsia; Vitamin-D Deficiency; and Right Upper Quadrant Abdominal Pain.  Says that she has been having some mild intermittent chest pain, however notes that there have been a lot of changes in her life coinciding with this: has started doing [employer] classes with a focus on chest Wednesday, her daughter has been ill with similar chest pain, and her husband may be pushing his retirement up. Denies pain radiating to jaw or arm.  History of GERD treated with Protonix  40 mg.  History of Allergies treated with Zyrtec 10 mg as needed.  History of Vitamin-D Deficiency treated with Vitamin-D + Calcium. 12/29/2022 Vitamin-D: 37    History of Bilateral Hearing Loss noted after being seen by Dr. 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 Fhx of Premature Hearing Loss, Maternal Grandmother & Father - father wear bilateral cochlear implants.   Labs 02/08/2024 CBC: WNL  C-MET: Potassium 5.7; otherwise WNL Lipid Panel: WNL TSH: 0.89   PAP Smear 01/10/2022 normal with repeat recommendation of 2026. Bimanual deferred. Hormone Replacement Therapy managed on Estradiol patch twice weekly and Progesterone 100 mg daily.  Mammogram 01/04/2024 normal with repeat recommendation of 2026.  Colonoscopy 03/11/2021 removed colon polyp(s) with repeat recommendation of 2027.  Bone Density T-score was  -2.7, osteoporotic. History of Osteoporosis managed on Tymlos 1.56 mL injected subcutaneously and Calcium + Vitamin-D.  Vaccine Counseling: Due for Influenza, Shingrix 1st dose, PNA, and Hepatitis B - discussed, she will update Influenza in 04/2024; regarding Hepatitis B she says she's completed the series twice with the last being in 2015; agreeable to updating Pneumonia; and postponed Shingrix; UTD on Tdap.  Review of Systems  Constitutional:  Negative for chills, fever, malaise/fatigue and weight loss.  HENT:  Negative for hearing loss, sinus pain and sore throat.   Respiratory:  Negative for cough, hemoptysis and shortness of breath.   Cardiovascular:  Positive for chest pain (mild intermittent). Negative for palpitations, leg swelling and PND.  Gastrointestinal:  Negative for abdominal pain, constipation, diarrhea, heartburn, nausea and vomiting.  Genitourinary:  Negative for dysuria, frequency and urgency.  Musculoskeletal:  Negative for back pain, myalgias and neck pain.  Skin:  Negative for itching and rash.  Neurological:  Negative for dizziness, tingling, seizures and headaches.  Endo/Heme/Allergies:  Negative for polydipsia.  Psychiatric/Behavioral:  Negative for depression. The patient is not nervous/anxious.    Objective:  Vitals: body mass index is 22.13 kg/m. Today's Vitals   02/22/24 1356  BP: 120/68  Pulse: 94  SpO2: 98%  Weight: 133 lb (60.3 kg)  Height: 5' 5 (1.651 m)   Physical Exam Vitals and nursing note reviewed.  Constitutional:      General: She is not in acute distress.    Appearance: Normal appearance. She is not ill-appearing or toxic-appearing.  HENT:     Head: Normocephalic and atraumatic.     Right Ear: Hearing, tympanic membrane, ear canal and external  ear normal.     Left Ear: Hearing, tympanic membrane, ear canal and external ear normal.     Mouth/Throat:     Pharynx: Oropharynx is clear.  Eyes:     Extraocular Movements: Extraocular  movements intact.     Pupils: Pupils are equal, round, and reactive to light.  Neck:     Thyroid: No thyroid mass, thyromegaly or thyroid tenderness.     Vascular: No carotid bruit.  Cardiovascular:     Rate and Rhythm: Normal rate and regular rhythm. No extrasystoles are present.    Pulses:          Dorsalis pedis pulses are 2+ on the right side and 2+ on the left side.     Heart sounds: Normal heart sounds. No murmur heard.    No friction rub. No gallop.  Pulmonary:     Effort: Pulmonary effort is normal.     Breath sounds: Normal breath sounds. No decreased breath sounds, wheezing, rhonchi or rales.  Chest:     Chest wall: No mass or tenderness.  Abdominal:     Palpations: Abdomen is soft. There is no hepatomegaly, splenomegaly or mass.     Tenderness: There is no abdominal tenderness.     Hernia: No hernia is present.  Musculoskeletal:     Cervical back: Normal range of motion.     Right lower leg: No edema.     Left lower leg: No edema.  Lymphadenopathy:     Cervical: No cervical adenopathy.     Upper Body:     Right upper body: No supraclavicular adenopathy.     Left upper body: No supraclavicular adenopathy.  Skin:    General: Skin is warm and dry.  Neurological:     General: No focal deficit present.     Mental Status: She is alert and oriented to person, place, and time. Mental status is at baseline.     Sensory: Sensation is intact.     Motor: Motor function is intact. No weakness.     Deep Tendon Reflexes: Reflexes are normal and symmetric.  Psychiatric:        Attention and Perception: Attention normal.        Mood and Affect: Mood normal.        Speech: Speech normal.        Behavior: Behavior normal.        Thought Content: Thought content normal.        Cognition and Memory: Cognition normal.        Judgment: Judgment normal.     Current Outpatient Medications  Medication Instructions   ascorbic acid (VITAMIN C) 250 MG tablet No dose, route, or  frequency recorded.   B-D UF III MINI PEN NEEDLES 31G X 5 MM MISC Daily   Calcium Carb-Cholecalciferol (OYSTER SHELL CALCIUM W/D) 500-5 MG-MCG TABS Take by mouth.   cetirizine (ZYRTEC) 10 mg, As needed   estradiol (VIVELLE-DOT) 0.05 MG/24HR patch 1 patch, 2 times weekly   Multiple Vitamin (MULTIVITAMIN ADULT PO) Take by mouth.   pantoprazole  (PROTONIX ) 40 mg, Oral, Daily   progesterone (PROMETRIUM) 100 mg, Daily   TYMLOS 3120 MCG/1.56ML SOPN Inject into the skin.   Past Medical History:  Diagnosis Date   Allergy    Heart murmur    Osteoporosis    Medical/Surgical History Narrative:  Allergic/Intolerant to:  Allergies  Allergen Reactions   Sulfa Antibiotics Swelling   Penicillins Rash   Tramadol Other (See Comments) and Nausea And  Vomiting    Other reaction(s): Vomiting   Latex Rash and Other (See Comments)   2020 - Deviated Nasal Septum evaluated by Dr. Mable  Past Surgical History:  Procedure Laterality Date   ABLATION     APPENDECTOMY     COLONOSCOPY W/ POLYPECTOMY     ganglion cyst removal     left percutaneous release     meniscus tear     TONSILLECTOMY     Family History  Problem Relation Age of Onset   Colon polyps Mother    Diabetes Mother    Colon polyps Father    Hyperlipidemia Father    Hypertension Father    Diabetes Father    Hearing loss Father    Hypertension Brother    Cancer Maternal Grandmother    COPD Maternal Grandfather    Diabetes Maternal Grandfather    Stroke Paternal Grandmother    Alzheimer's disease Paternal Grandmother    Diabetes Paternal Grandfather    Colon cancer Neg Hx    Esophageal cancer Neg Hx    Rectal cancer Neg Hx    Stomach cancer Neg Hx    Family History Narrative: No Family History of GI Cancers Paternal Grandfather w/ hx of Diabetes Paternal Grandmother w/ hx of Stroke and Alzheimer's Disease Father w/ hx of Hypertension, Hyperlipidemia, Diabetes, Colon Polyps, S/p Cholecystectomy, and Hearing Loss Maternal  Grandfather w/ hx of COPD Maternal Grandmother w/ hx of Cancer (Unknown) and Premature Hearing Loss Mother w/ hx of Colon Polyps, S/p Cholecystectomy, and Diabetes Siblings, Brother w/ hx of Hypertension General Fhx of Acid Reflux Social History   Social History Narrative   Works as a [employer] for [employer], 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 Married - husband is a Loss adjuster, chartered, retirement upcoming late 2025. Non-smoker. Has 2 adult children, ~24 & ~28 - 1 daughter and has welcomed her 1st grandson recently.   Most Recent Social Determinants of Health (Including Hx of Tobacco, Alcohol, and Drug Use) SDOH Screenings   Food Insecurity: No Food Insecurity (02/22/2024)  Housing: Low Risk  (02/22/2024)  Transportation Needs: No Transportation Needs (02/22/2024)  Alcohol Screen: Low Risk  (02/22/2024)  Depression (PHQ2-9): Low Risk  (02/22/2024)  Financial Resource Strain: Low Risk  (02/22/2024)  Physical Activity: Sufficiently Active (02/22/2024)  Social Connections: Socially Integrated (02/22/2024)  Stress: No Stress Concern Present (02/22/2024)  Tobacco Use: Low Risk  (02/22/2024)   Social History   Tobacco Use   Smoking status: Never   Smokeless tobacco: Never  Vaping Use   Vaping status: Never Used  Substance Use Topics   Alcohol use: Yes    Alcohol/week: 1.0 standard drink of alcohol    Types: 1 Cans of beer per week    Comment: social   Drug use: No   Most Recent Fall Risk Assessment:    12/29/2022   11:39 AM  Fall Risk   Falls in the past year? 0  Number falls in past yr: 0  Injury with Fall? 0  Risk for fall due to : No Fall Risks  Follow up Falls evaluation completed   Most Recent Anxiety/Depression Screenings:    02/22/2024    2:06 PM 02/09/2023    3:05 PM  PHQ 2/9 Scores  PHQ - 2 Score 0 0   Results:  Studies Obtained And Personally Reviewed By Me:  02/22/2024 EKG determined to be within normal limits.  PAP Smear 01/10/2022 normal.  Mammogram 01/04/2024  normal.  Colonoscopy 03/11/2021 removed colon  polyp(s).  Bone Density T-score was -2.7, osteoporotic.   Labs:  CBC w/ Differential Lab Results  Component Value Date   WBC 4.7 02/08/2024   RBC 4.33 02/08/2024   HGB 13.0 02/08/2024   HCT 40.1 02/08/2024   PLT 258 02/08/2024   MCV 92.6 02/08/2024   MCH 30.0 02/08/2024   MCHC 32.4 02/08/2024   RDW 11.7 02/08/2024   MPV 11.1 02/08/2024   LYMPHSABS 1,452 12/29/2022   BASOSABS 42 02/08/2024    Comprehensive Metabolic Panel Lab Results  Component Value Date   NA 142 02/08/2024   K 5.7 (H) 02/08/2024   CL 104 02/08/2024   CO2 28 02/08/2024   GLUCOSE 95 02/08/2024   BUN 23 02/08/2024   CREATININE 0.67 02/08/2024   CALCIUM 10.4 02/08/2024   PROT 6.9 02/08/2024   AST 19 02/08/2024   ALT 13 02/08/2024   BILITOT 0.5 02/08/2024   EGFR 97 12/29/2022   GFRNONAA 103 11/12/2020   Lipid Panel  Lab Results  Component Value Date   CHOL 191 02/08/2024   HDL 81 02/08/2024   LDLCALC 93 02/08/2024   TRIG 78 02/08/2024   TSH Lab Results  Component Value Date   TSH 0.89 02/08/2024   Assessment & Plan:   Orders Placed This Encounter  Procedures   POCT URINALYSIS DIP (CLINITEK)   EKG 12-Lead  Other Labs Reviewed today: CBC: WNL  C-MET: Potassium 5.7; otherwise WNL Lipid Panel: WNL TSH: 0.89   She has been having some Mild Intermittent Chest Pain, however notes that there have been a lot of changes in her life coinciding with this: has started doing [employer] classes with a focus on chest Wednesday, her daughter has been ill with similar chest pain, and her husband may be pushing his retirement up. Denies pain radiating to jaw or arm.  EKG performed and reviewed: normal.  GERD treated with Protonix  40 mg.  Allergies treated with Zyrtec 10 mg as needed.  Vitamin-D Deficiency treated with Vitamin-D + Calcium. 12/29/2022 Vitamin-D: 37    Bilateral Hearing Loss noted after being seen by Dr. 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 Fhx of Premature Hearing Loss, Maternal Grandmother & Father - father wear bilateral cochlear implants.   PAP Smear 01/10/2022 normal with repeat recommendation of 2026. Bimanual deferred.   Hormone Replacement Therapy managed on Estradiol patch twice weekly and Progesterone 100 mg daily.  Mammogram 01/04/2024 normal with repeat recommendation of 2026.  Colonoscopy 03/11/2021 removed colon polyp(s) with repeat recommendation of 2027.  Bone Density T-score was -2.7, osteoporotic.   Osteoporosis managed on Tymlos 1.56 mL injected subcutaneously and Calcium + Vitamin-D.  Vaccine Counseling: Due for Influenza, Shingrix 1st dose, PNA, and Hepatitis B - discussed, she will update Influenza in 04/2024; regarding Hepatitis B she says she's completed the series twice with the last being in 2015; agreeable to updating Pneumonia; and postponed Shingrix; UTD on Tdap.  Return in 53 weeks (on 02/27/2025) for annual labs, and then on 03/06/2025 for annual visit, or as needed.   Annual Wellness Visit done today including the all of the following: Reviewed patient's Family Medical History Reviewed patient's SDOH and reviewed tobacco, alcohol, and drug use.  Reviewed and updated list of patient's medical providers Assessment of cognitive impairment was done Assessed patient's functional ability Established a written schedule for health screening services Health Risk Assessent Completed and Reviewed  Discussed health benefits of physical activity,  and encouraged her to engage in regular exercise appropriate for her age and condition.   I,Emily Lagle,acting as a Neurosurgeon for Ronal JINNY Hailstone, MD.,have documented all relevant documentation on the behalf of Ronal JINNY Hailstone, MD,as directed by  Ronal JINNY Hailstone, MD while in the presence of Ronal JINNY Hailstone, MD.  I, Ronal JINNY Hailstone, MD, have reviewed all documentation for this visit. The  documentation on 02/22/2024 for the exam, diagnosis, procedures, and orders are all accurate and complete.

## 2024-02-25 ENCOUNTER — Encounter: Payer: Self-pay | Admitting: Internal Medicine

## 2024-02-27 NOTE — Patient Instructions (Addendum)
 It was a pleasure to see you today. We can make referral to Cardiology for chest pain if symptoms perisist. Glad you saw Dr. Federico about right upper quadrant pain. Work up was very thorough. Continue treatment for osteoporosis. Follow up in one year or as needed.

## 2025-02-27 ENCOUNTER — Other Ambulatory Visit: Payer: Self-pay

## 2025-03-06 ENCOUNTER — Encounter: Payer: Self-pay | Admitting: Internal Medicine
